# Patient Record
Sex: Male | Born: 1985 | Race: White | Hispanic: No | Marital: Single | Smoking: Current every day smoker
Health system: Southern US, Community
[De-identification: ages and names within clinical notes are randomized; demographics above are authoritative.]

## PROBLEM LIST (undated history)

## (undated) DIAGNOSIS — K219 Gastro-esophageal reflux disease without esophagitis: Secondary | ICD-10-CM

## (undated) DIAGNOSIS — F1910 Other psychoactive substance abuse, uncomplicated: Secondary | ICD-10-CM

## (undated) HISTORY — PX: WISDOM TOOTH EXTRACTION: SHX21

---
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
## 2012-07-18 ENCOUNTER — Encounter (HOSPITAL_COMMUNITY): Payer: Self-pay | Admitting: *Deleted

## 2012-07-18 DIAGNOSIS — Y929 Unspecified place or not applicable: Secondary | ICD-10-CM | POA: Insufficient documentation

## 2012-07-18 DIAGNOSIS — Y990 Civilian activity done for income or pay: Secondary | ICD-10-CM | POA: Insufficient documentation

## 2012-07-18 DIAGNOSIS — X503XXA Overexertion from repetitive movements, initial encounter: Secondary | ICD-10-CM | POA: Insufficient documentation

## 2012-07-18 DIAGNOSIS — F17200 Nicotine dependence, unspecified, uncomplicated: Secondary | ICD-10-CM | POA: Insufficient documentation

## 2012-07-18 DIAGNOSIS — Y9389 Activity, other specified: Secondary | ICD-10-CM | POA: Insufficient documentation

## 2012-07-18 DIAGNOSIS — IMO0002 Reserved for concepts with insufficient information to code with codable children: Secondary | ICD-10-CM | POA: Insufficient documentation

## 2012-07-18 NOTE — ED Notes (Signed)
The pt has had  Pain in his lower back or just above his waist since he lifted an object today at work.  No previous history

## 2012-07-19 ENCOUNTER — Emergency Department (HOSPITAL_COMMUNITY)
Admission: EM | Admit: 2012-07-19 | Discharge: 2012-07-19 | Disposition: A | Discharge status: Home / Self Care | Payer: Self-pay | Attending: Emergency Medicine | Admitting: Emergency Medicine

## 2012-07-19 DIAGNOSIS — S39012A Strain of muscle, fascia and tendon of lower back, initial encounter: Secondary | ICD-10-CM

## 2012-07-19 MED ORDER — METHOCARBAMOL 500 MG PO TABS: 500.0000 mg | ORAL_TABLET | Freq: Two times a day (BID) | ORAL | Status: DC

## 2012-07-19 MED ORDER — IBUPROFEN 800 MG PO TABS: 800.0000 mg | ORAL_TABLET | Freq: Three times a day (TID) | ORAL | Status: DC | PRN

## 2012-07-19 NOTE — ED Provider Notes (Signed)
Medical screening examination/treatment/procedure(s) were performed by non-physician practitioner and as supervising physician I was immediately available for consultation/collaboration.  Jasmine Awe, MD 07/19/12 [phone]

## 2012-07-19 NOTE — ED Provider Notes (Signed)
History     CSN: [id number]  Arrival date & time 07/18/12  2239   First MD Initiated Contact with Patient 07/19/12 0055      Chief Complaint  Patient presents with  . Back Pain   HPI  History provided by the patient. Patient is a 27 year old male with no significant PMH who presents with complaints of low back pain and injury at work. 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 Pain is greatest on the right side. Injury occurred early in his work shift and he continued to work with worsening pain. He reports that a fellow coworker gave him a Tylenol 3 which did not help significantly with pain. Pain does not radiate. Denies any weakness or numbness in the extremities. No urinary or fecal incontinence, urinary retention or perineal numbness. Denies any previous back injuries or pain. He has not used any other treatments for symptoms. Pain is worse with movement or position. No other aggravating or alleviating factors. No other associated symptoms.     History reviewed. No pertinent past medical history.  History reviewed. No pertinent past surgical history.  No family history on file.  History  Substance Use Topics  . Smoking status: Current Every Day Smoker  . Smokeless tobacco: Not on file  . Alcohol Use: Yes      Review of Systems  Respiratory: Negative for shortness of breath.   Cardiovascular: Negative for chest pain.  Genitourinary: Negative for dysuria, frequency, hematuria and flank pain.  Musculoskeletal: Positive for back pain.  Neurological: Negative for weakness and numbness.  All other systems reviewed and are negative.    Allergies  Review of patient's allergies indicates no known allergies.  Home Medications  No current outpatient prescriptions on file.  BP 128/90  Pulse 106  Temp(Src) 97.6 F (36.4 C) (Oral)  Resp 16  SpO2 100%  Physical Exam  Nursing  note and vitals reviewed. Constitutional: He is oriented to person, place, and time. He appears well-developed and well-nourished. No distress.  HENT:  Head: Normocephalic.  Cardiovascular: Normal rate and regular rhythm.   Pulmonary/Chest: Effort normal and breath sounds normal. No respiratory distress. He has no wheezes. He has no rales.  Abdominal: Soft.  Musculoskeletal: Normal range of motion. He exhibits no edema and no tenderness.       Cervical back: Normal.       Thoracic back: Normal.       Lumbar back: He exhibits tenderness. He exhibits normal range of motion and no bony tenderness.       Back:  Neurological: He is alert and oriented to person, place, and time. He has normal strength. No sensory deficit. Gait normal.  Skin: Skin is warm. No erythema.  Psychiatric: He has a normal mood and affect. His behavior is normal.    ED Course  Procedures     1. Strain of lumbar paraspinal muscle, initial encounter       MDM  1:00 AM patient seen and evaluated. Patient appears well. Normal gait. No spinous tenderness. At this time suspect muscle sprain and injury. Will instruct on conservative treatment.        Angus Seller, PA-C 07/19/12 0126

## 2012-07-19 NOTE — ED Notes (Signed)
Pt reports lifting boxes at work last evening and injuring his back. States that he has muscle soreness, pain in right side of back. Pt states injury occurred around 2030 last evening. States he took a tylenol #3 around 2230 with no relief.

## 2014-04-09 ENCOUNTER — Encounter (HOSPITAL_COMMUNITY): Payer: Self-pay | Admitting: Emergency Medicine

## 2014-04-09 ENCOUNTER — Emergency Department (HOSPITAL_COMMUNITY)
Admission: EM | Admit: 2014-04-09 | Discharge: 2014-04-09 | Disposition: A | Discharge status: Home / Self Care | Payer: BLUE CROSS/BLUE SHIELD | Attending: Emergency Medicine | Admitting: Emergency Medicine

## 2014-04-09 DIAGNOSIS — Y998 Other external cause status: Secondary | ICD-10-CM | POA: Diagnosis not present

## 2014-04-09 DIAGNOSIS — S29092A Other injury of muscle and tendon of back wall of thorax, initial encounter: Secondary | ICD-10-CM | POA: Diagnosis not present

## 2014-04-09 DIAGNOSIS — Y92410 Unspecified street and highway as the place of occurrence of the external cause: Secondary | ICD-10-CM | POA: Diagnosis not present

## 2014-04-09 DIAGNOSIS — Z720 Tobacco use: Secondary | ICD-10-CM | POA: Diagnosis not present

## 2014-04-09 DIAGNOSIS — M546 Pain in thoracic spine: Secondary | ICD-10-CM

## 2014-04-09 DIAGNOSIS — Y9389 Activity, other specified: Secondary | ICD-10-CM | POA: Insufficient documentation

## 2014-04-09 MED ORDER — NAPROXEN 500 MG PO TABS: 500.0000 mg | ORAL_TABLET | Freq: Two times a day (BID) | ORAL | Status: DC

## 2014-04-09 MED ORDER — KETOROLAC TROMETHAMINE 60 MG/2ML IM SOLN
60.0000 mg | Freq: Once | INTRAMUSCULAR | Status: AC
  Administered 2014-04-09: 60 mg via INTRAMUSCULAR
  Filled 2014-04-09: qty 2

## 2014-04-09 MED ORDER — LORAZEPAM 1 MG PO TABS
1.0000 mg | ORAL_TABLET | Freq: Once | ORAL | Status: AC
  Administered 2014-04-09: 1 mg via ORAL
  Filled 2014-04-09: qty 1

## 2014-04-09 MED ORDER — METHOCARBAMOL 500 MG PO TABS: 500.0000 mg | ORAL_TABLET | Freq: Two times a day (BID) | ORAL | Status: DC
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
## 2014-04-09 NOTE — ED Notes (Signed)
Patient instructed to wait until 1215 to leave department due to administration of IM injection.

## 2014-04-09 NOTE — Discharge Instructions (Signed)
Arthralgia Your caregiver has diagnosed you as suffering from an arthralgia. Arthralgia means there is pain in a joint. This can come from many reasons including:  Bruising the joint which causes soreness (inflammation) in the joint.  Wear and tear on the joints which occur as we grow older (osteoarthritis).  Overusing the joint.  Various forms of arthritis.  Infections of the joint. Regardless of the cause of pain in your joint, most of these different pains respond to anti-inflammatory drugs and rest. The exception to this is when a joint is infected, and these cases are treated with antibiotics, if it is a bacterial infection. HOME CARE INSTRUCTIONS   Rest the injured area for as long as directed by your caregiver. Then slowly start using the joint as directed by your caregiver and as the pain allows. Crutches as directed may be useful if the ankles, knees or hips are involved. If the knee was splinted or casted, continue use and care as directed. If an stretchy or elastic wrapping bandage has been applied today, it should be removed and re-applied every 3 to 4 hours. It should not be applied tightly, but firmly enough to keep swelling down. Watch toes and feet for swelling, bluish discoloration, coldness, numbness or excessive pain. If any of these problems (symptoms) occur, remove the ace bandage and re-apply more loosely. If these symptoms persist, contact your caregiver or return to this location.  For the first 24 hours, keep the injured extremity elevated on pillows while lying down.  Apply ice for 15-20 minutes to the sore joint every couple hours while awake for the first half day. Then 03-04 times per day for the first 48 hours. Put the ice in a plastic bag and place a towel between the bag of ice and your skin.  Wear any splinting, casting, elastic bandage applications, or slings as instructed.  Only take over-the-counter or prescription medicines for pain, discomfort, or fever as  directed by your caregiver. Do not use aspirin immediately after the injury unless instructed by your physician. Aspirin can cause increased bleeding and bruising of the tissues.  If you were given crutches, continue to use them as instructed and do not resume weight bearing on the sore joint until instructed. Persistent pain and inability to use the sore joint as directed for more than 2 to 3 days are warning signs indicating that you should see a caregiver for a follow-up visit as soon as possible. Initially, a hairline fracture (break in bone) may not be evident on X-rays. Persistent pain and swelling indicate that further evaluation, non-weight bearing or use of the joint (use of crutches or slings as instructed), or further X-rays are indicated. X-rays may sometimes not show a small fracture until a week or 10 days later. Make a follow-up appointment with your own caregiver or one to whom we have referred you. A radiologist (specialist in reading X-rays) may read your X-rays. Make sure you know how you are to obtain your X-ray results. Do not assume everything is normal if you do not hear from [place]. Back Pain, Adult Low back pain is very common. About 1 in 5 people have back pain.The cause of low back pain is rarely dangerous. The pain often gets better over time.About half of people with a sudden onset of back pain feel better in just 2 weeks. About 8 in 10 people feel better by 6 weeks.  CAUSES Some common causes of back pain include:  Strain of the muscles or  ligaments supporting the spine.  Wear and tear (degeneration) of the spinal discs.  Arthritis.  Direct injury to the back. DIAGNOSIS Most of the time, the direct cause of low back pain is not known.However, back pain can be treated effectively even when the exact cause of the pain is unknown.Answering your caregiver's questions about your overall health and symptoms is one of the most accurate ways to make sure the cause of your pain  is not dangerous. If your caregiver needs more information, he or she may order lab work or imaging tests (X-rays or MRIs).However, even if imaging tests show changes in your back, this usually does not require surgery. HOME CARE INSTRUCTIONS For many people, back pain returns.Since low back pain is rarely dangerous, it is often a condition that people can learn to [facility] their own.   Remain active. It is stressful on the back to sit or stand in one place. Do not sit, drive, or stand in one place for more than 30 minutes at a time. Take short walks on level surfaces as soon as pain allows.Try to increase the length of time you walk each day.  Do not stay in bed.Resting more than 1 or 2 days can delay your recovery.  Do not avoid exercise or work.Your body is made to move.It is not dangerous to be active, even though your back may hurt.Your back will likely heal faster if you return to being active before your pain is gone.  Pay attention to your body when you bend and lift. Many people have less discomfortwhen lifting if they bend their knees, keep the load close to their bodies,and avoid twisting. Often, the most comfortable positions are those that put less stress on your recovering back.  Find a comfortable position to sleep. Use a firm mattress and lie on your side with your knees slightly bent. If you lie on your back, put a pillow under your knees.  Only take over-the-counter or prescription medicines as directed by your caregiver. Over-the-counter medicines to reduce pain and inflammation are often the most helpful.Your caregiver may prescribe muscle relaxant drugs.These medicines help dull your pain so you can more quickly return to your normal activities and healthy exercise.  Put ice on the injured area.  Put ice in a plastic bag.  Place a towel between your skin and the bag.  Leave the ice on for 15-20 minutes, 03-04 times a day for the first 2 to 3 days. After that,  ice and heat may be alternated to reduce pain and spasms.  Ask your caregiver about trying back exercises and gentle massage. This may be of some benefit.  Avoid feeling anxious or stressed.Stress increases muscle tension and can worsen back pain.It is important to recognize when you are anxious or stressed and learn ways to manage it.Exercise is a great option. SEEK MEDICAL CARE IF:  You have pain that is not relieved with rest or medicine.  You have pain that does not improve in 1 week.  You have new symptoms.  You are generally not feeling well. SEEK IMMEDIATE MEDICAL CARE IF:   You have pain that radiates from your back into your legs.  You develop new bowel or bladder control problems.  You have unusual weakness or numbness in your arms or legs.  You develop nausea or vomiting.  You develop abdominal pain.  You feel faint. Document Released: 02/01/2005 Document Revised: 08/03/2011 Document Reviewed: 06/05/2013 [facility] Patient Information 2015 [place], [place]. This information is not  intended to replace advice given to you by your health care provider. Make sure you discuss any questions you have with your health care provider.  SEEK MEDICAL CARE IF: Bruising, swelling, or pain increases. SEEK IMMEDIATE MEDICAL CARE IF:   Your fingers or toes are numb or blue.  The pain is not responding to medications and continues to stay the same or get worse.  The pain in your joint becomes severe.  You develop a fever over 102 F (38.9 C).  It becomes impossible to move or use the joint. MAKE SURE YOU:   Understand these instructions.  Will watch your condition.  Will get help right away if you are not doing well or get worse. Document Released: 02/01/2005 Document Revised: 04/26/2011 Document Reviewed: 09/20/2007 [facility] Patient Information 2015 [place], [place]. This information is not intended to replace advice given to you by your health care provider. Make sure  you discuss any questions you have with your health care provider.  It is important to follow up with your primary care, [place] and wellness for further evaluation and management of your symptoms. Return to ED for new or worsening symptoms. Please take your pain medications as directed. You may not drive, operate heavy machinery while using your Robaxin.

## 2017-11-03 ENCOUNTER — Emergency Department (HOSPITAL_COMMUNITY): Payer: BLUE CROSS/BLUE SHIELD

## 2017-11-03 ENCOUNTER — Emergency Department (HOSPITAL_COMMUNITY)
Admission: EM | Admit: 2017-11-03 | Discharge: 2017-11-03 | Disposition: A | Discharge status: Home / Self Care | Payer: BLUE CROSS/BLUE SHIELD | Attending: Emergency Medicine | Admitting: Emergency Medicine

## 2017-11-03 ENCOUNTER — Encounter (HOSPITAL_COMMUNITY): Payer: Self-pay | Admitting: Emergency Medicine

## 2017-11-03 DIAGNOSIS — Y999 Unspecified external cause status: Secondary | ICD-10-CM | POA: Diagnosis not present

## 2017-11-03 DIAGNOSIS — F17210 Nicotine dependence, cigarettes, uncomplicated: Secondary | ICD-10-CM | POA: Insufficient documentation

## 2017-11-03 DIAGNOSIS — Y939 Activity, unspecified: Secondary | ICD-10-CM | POA: Diagnosis not present

## 2017-11-03 DIAGNOSIS — M25532 Pain in left wrist: Secondary | ICD-10-CM | POA: Diagnosis present

## 2017-11-03 DIAGNOSIS — W010XXA Fall on same level from slipping, tripping and stumbling without subsequent striking against object, initial encounter: Secondary | ICD-10-CM | POA: Diagnosis not present

## 2017-11-03 NOTE — ED Triage Notes (Signed)
Pt c/o left wrist injury playing basketball 2 days ago. Reports hurts with movement. Doesn't use pain meds

## 2017-11-03 NOTE — Discharge Instructions (Addendum)
As discussed, today's evaluation has been generally reassuring peer There is no evidence for fracture of your wrist. You likely sprained the ligaments of your wrist. Please use the provided wrist splint for the next 10 days.  Please use ibuprofen, 400 mg, 3 times daily for pain control, as well as ice packs.

## 2017-11-03 NOTE — ED Provider Notes (Signed)
[facility]-EMERGENCY DEPT Provider Note   CSN: [id number] Arrival date & time: 11/03/17  0849     History   Chief Complaint Chief Complaint  Patient presents with  . Wrist Pain    HPI Ian Walker is a 32 y.o. male.  HPI Patient presents 2 days after sustaining an injury to his left wrist, with concern of ongoing pain. Patient fell, onto his wrist which was in extension. Since that time he has had soreness, which is actually improved, but is persistent. I will relief with cryotherapy, no medication taken. No elbow, shoulder or other pain. He states that he is generally well, but has been unable to work secondary to the pain.  History reviewed. No pertinent past medical history.  There are no active problems to display for this patient.   Past Surgical History:  Procedure Laterality Date  . WISDOM TOOTH EXTRACTION          Home Medications    Prior to Admission medications   Not on File    Family History No family history on file.  Social History Social History   Tobacco Use  . Smoking status: Current Every Day Smoker    Packs/day: 0.75    Types: Cigarettes  . Smokeless tobacco: Never Used  Substance Use Topics  . Alcohol use: Yes    Comment: occasionally  . Drug use: No     Allergies   Patient has no known allergies.   Review of Systems Review of Systems  Constitutional: Negative for fever.  Respiratory: Negative for shortness of breath.   Cardiovascular: Negative for chest pain.  Musculoskeletal:       Negative aside from HPI  Skin:       Negative aside from HPI  Allergic/Immunologic: Negative for immunocompromised state.  Neurological: Negative for weakness.     Physical Exam Updated Vital Signs BP (!) 148/82 (BP Location: Left Arm)   Pulse 88   Temp 98.6 F (37 C) (Oral)   Resp 16   Ht 6' 1.5" (1.867 m)   Wt 77.1 kg   SpO2 99%   BMI 22.12 kg/m   Physical Exam  Constitutional: He is oriented to  person, place, and time. He appears well-developed. No distress.  HENT:  Head: Normocephalic and atraumatic.  Eyes: Conjunctivae and EOM are normal.  Cardiovascular: Normal rate, regular rhythm and intact distal pulses.  Pulmonary/Chest: Effort normal. No stridor. No respiratory distress.  Abdominal: He exhibits no distension.  Musculoskeletal: He exhibits no edema.       Left elbow: Normal.       Left wrist: He exhibits tenderness, bony tenderness and swelling. He exhibits normal range of motion, no effusion, no crepitus, no deformity and no laceration.  Neurological: He is alert and oriented to person, place, and time.  Skin: Skin is warm and dry.  Psychiatric: He has a normal mood and affect.  Nursing note and vitals reviewed.    ED Treatments / Results   Radiology Dg Wrist Complete Left  Result Date: 11/03/2017 CLINICAL DATA:  Basketball injury EXAM: LEFT WRIST - COMPLETE 3+ VIEW COMPARISON:  None. FINDINGS: Mild posterior soft tissue swelling within the left wrist. No acute bony abnormality. Specifically, no fracture, subluxation, or dislocation. Joint spaces maintained. IMPRESSION: No acute bony abnormality. Electronically Signed   By: Charlett Nose M.D.   On: 11/03/2017 09:32    Procedures Procedures (including critical care time)  Medications Ordered in ED Medications - No data to display  Initial Impression / Assessment and Plan / ED Course  I have reviewed the triage vital signs and the nursing notes.  Pertinent labs & imaging results that were available during my care of the patient were reviewed by me and considered in my medical decision making (see chart for details).  After the initial evaluation patient had placement in wrist immobilization splint by orthopedic technician.  Young male presents with isolated wrist pain. Patient is otherwise well-appearing, in no distress, has no distal neurovascular compromise X-ray reassuring, and I reviewed the images myself,  demonstrated them to the patient as well. Immobilization, NSAID, ice pack for likely wrist sprain.  Final Clinical Impressions(s) / ED Diagnoses   Final diagnoses:  Left wrist pain     Gerhard MunchLockwood, Keondra Haydu, MD 11/03/17 [phone]

## 2017-11-03 NOTE — ED Notes (Signed)
Patient unable to sign. Patient verbalizes consent and understanding of discharge paperwork.

## 2017-11-03 NOTE — ED Notes (Signed)
Ortho tech called 

## 2020-08-29 ENCOUNTER — Other Ambulatory Visit: Payer: Self-pay

## 2020-08-29 ENCOUNTER — Emergency Department (HOSPITAL_BASED_OUTPATIENT_CLINIC_OR_DEPARTMENT_OTHER): Payer: BC Managed Care – PPO

## 2020-08-29 ENCOUNTER — Other Ambulatory Visit (HOSPITAL_BASED_OUTPATIENT_CLINIC_OR_DEPARTMENT_OTHER): Payer: Self-pay

## 2020-08-29 ENCOUNTER — Encounter (HOSPITAL_BASED_OUTPATIENT_CLINIC_OR_DEPARTMENT_OTHER): Payer: Self-pay

## 2020-08-29 ENCOUNTER — Emergency Department (HOSPITAL_BASED_OUTPATIENT_CLINIC_OR_DEPARTMENT_OTHER)
Admission: EM | Admit: 2020-08-29 | Discharge: 2020-08-29 | Disposition: A | Discharge status: Home / Self Care | Payer: BC Managed Care – PPO | Attending: Emergency Medicine | Admitting: Emergency Medicine

## 2020-08-29 DIAGNOSIS — Z20822 Contact with and (suspected) exposure to covid-19: Secondary | ICD-10-CM | POA: Insufficient documentation

## 2020-08-29 DIAGNOSIS — J181 Lobar pneumonia, unspecified organism: Secondary | ICD-10-CM | POA: Insufficient documentation

## 2020-08-29 DIAGNOSIS — R509 Fever, unspecified: Secondary | ICD-10-CM | POA: Diagnosis present

## 2020-08-29 DIAGNOSIS — J189 Pneumonia, unspecified organism: Secondary | ICD-10-CM

## 2020-08-29 DIAGNOSIS — F17210 Nicotine dependence, cigarettes, uncomplicated: Secondary | ICD-10-CM | POA: Diagnosis not present

## 2020-08-29 HISTORY — DX: Gastro-esophageal reflux disease without esophagitis: K21.9

## 2020-08-29 LAB — RESP PANEL BY RT-PCR (FLU A&B, COVID) ARPGX2
Influenza A by PCR: NEGATIVE
Influenza B by PCR: NEGATIVE
SARS Coronavirus 2 by RT PCR: NEGATIVE

## 2020-08-29 MED ORDER — AZITHROMYCIN 250 MG PO TABS
250.0000 mg | ORAL_TABLET | Freq: Every day | ORAL | 0 refills | Status: DC
  Filled 2020-08-29: qty 6, 5d supply, fill #0

## 2020-08-29 MED ORDER — ONDANSETRON 4 MG PO TBDP
4.0000 mg | ORAL_TABLET | Freq: Three times a day (TID) | ORAL | 0 refills | Status: DC | PRN
  Filled 2020-08-29: qty 20, 7d supply, fill #0

## 2020-08-29 MED ORDER — KETOROLAC TROMETHAMINE 30 MG/ML IJ SOLN
15.0000 mg | Freq: Once | INTRAMUSCULAR | Status: AC
  Administered 2020-08-29: 15 mg via INTRAMUSCULAR
  Filled 2020-08-29: qty 1

## 2020-08-29 MED ORDER — AMOXICILLIN-POT CLAVULANATE 875-125 MG PO TABS
1.0000 | ORAL_TABLET | Freq: Two times a day (BID) | ORAL | 0 refills | Status: AC
  Filled 2020-08-29: qty 10, 5d supply, fill #0

## 2020-08-29 NOTE — Discharge Instructions (Addendum)
There is evidence of pneumonia on the chest x-ray.  Antiinflammatory medications: Take 600 mg of ibuprofen every 6 hours or 440 mg (over the counter dose) to 500 mg (prescription dose) of naproxen every 12 hours for the next 3 days. After this time, these medications may be used as needed for pain. Take these medications with food to avoid upset stomach. Choose only one of these medications, do not take them together. Acetaminophen (generic for Tylenol): Should you continue to have additional pain while taking the ibuprofen or naproxen, you may add in acetaminophen as needed. Your daily total maximum amount of acetaminophen from all sources should be limited to 4000mg /day for persons without liver problems, or 2000mg /day for those with liver problems.  Please take all of your antibiotics until finished!   You may develop abdominal discomfort or diarrhea from the antibiotic.  You may help offset this with probiotics which you can buy or get in yogurt. Do not eat or take the probiotics until 2 hours after your antibiotic.   Nausea/vomiting: Use the ondansetron (generic for Zofran) for nausea or vomiting.  This medication may not prevent all vomiting or nausea, but can help facilitate better hydration. Things that can help with nausea/vomiting also include peppermint/menthol candies, vitamin B12, and ginger.  Follow-up: Follow-up with a primary care provider for any further management of this issue.  Return: Return to emergency department for shortness of breath, worsening pain, persistent vomiting, or any other major concerns.

## 2020-08-29 NOTE — ED Provider Notes (Signed)
[facility] EMERGENCY DEPARTMENT Provider Note   CSN: [id number] Arrival date & time: 08/29/20  1129     History Chief Complaint  Patient presents with   Fever    Ian Walker is a 35 y.o. male.   Fever Associated symptoms: chest pain, cough, myalgias and nausea   Associated symptoms: no diarrhea and no vomiting       Ian Walker is a 35 y.o. male, with a history of GERD, presenting to the ED with fever, chills, sore throat, cough, nausea beginning 3 days ago. Also endorses soreness in the right upper chest and to the right of the sternum, worse with movement or deep breathing, mild to moderate, nonradiating from these locations. Fever with T-max 101 F. 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 Denies shortness of breath, abdominal pain, difficulty swallowing, diarrhea, or any other complaints.    Past Medical History:  Diagnosis Date   GERD (gastroesophageal reflux disease)     There are no problems to display for this patient.   Past Surgical History:  Procedure Laterality Date   WISDOM TOOTH EXTRACTION         No family history on file.  Social History   Tobacco Use   Smoking status: Every Day    Packs/day: 0.75    Types: Cigarettes   Smokeless tobacco: Never  Vaping Use   Vaping Use: Never used  Substance Use Topics   Alcohol use: Yes    Comment: weekly   Drug use: Yes    Types: Marijuana    Home Medications Prior to Admission medications   Medication Sig Start Date End Date Taking? Authorizing Provider  amoxicillin-clavulanate (AUGMENTIN) 875-125 MG tablet Take 1 tablet by mouth every 12 (twelve) hours for 5 days. 08/29/20 09/03/20 Yes Tatiyana Foucher C, PA-C  azithromycin (ZITHROMAX) 250 MG tablet Take first 2 tablets by mouth together, then 1 tablet every day until finished. 08/29/20  Yes Danea Manter C, PA-C  ondansetron (ZOFRAN ODT)  4 MG disintegrating tablet Take 1 tablet (4 mg total) by mouth every 8 (eight) hours as needed for nausea or vomiting. 08/29/20  Yes Herrick Hartog C, PA-C    Allergies    Patient has no known allergies.  Review of Systems   Review of Systems  Constitutional:  Positive for fever.  Respiratory:  Positive for cough. Negative for shortness of breath.   Cardiovascular:  Positive for chest pain. Negative for leg swelling.  Gastrointestinal:  Positive for nausea. Negative for abdominal pain, diarrhea and vomiting.  Musculoskeletal:  Positive for myalgias.  Neurological:  Negative for syncope and weakness.   Physical Exam Updated Vital Signs BP (!) 147/107 (BP Location: Left Arm)   Pulse (!) 117   Temp 98.2 F (36.8 C) (Oral)   Resp 18   Ht 6\' 1"  (1.854 m)   Wt 74.8 kg   SpO2 98%   BMI 21.77 kg/m   Physical Exam Vitals and nursing note reviewed.  Constitutional:      General: He is not in acute distress.    Appearance: He is well-developed. He is not diaphoretic.  HENT:     Head: Normocephalic and atraumatic.     Mouth/Throat:     Mouth: Mucous membranes are moist.     Pharynx: Oropharynx is clear.  Eyes:     Conjunctiva/sclera: Conjunctivae normal.  Cardiovascular:  Rate and Rhythm: Normal rate and regular rhythm.     Pulses: Normal pulses.          Radial pulses are 2+ on the right side and 2+ on the left side.       Posterior tibial pulses are 2+ on the right side and 2+ on the left side.     Heart sounds: Normal heart sounds. No murmur heard. Pulmonary:     Effort: Pulmonary effort is normal. No respiratory distress.     Breath sounds: Normal breath sounds.     Comments: No increased work of breathing.  Speaks in full sentences without difficulty. Abdominal:     Tenderness: There is no guarding.  Musculoskeletal:     Cervical back: Neck supple.     Right lower leg: No edema.     Left lower leg: No edema.  Lymphadenopathy:     Cervical: No cervical adenopathy.   Skin:    General: Skin is warm and dry.  Neurological:     Mental Status: He is alert.  Psychiatric:        Mood and Affect: Mood and affect normal.        Speech: Speech normal.        Behavior: Behavior normal.    ED Results / Procedures / Treatments   Labs (all labs ordered are listed, but only abnormal results are displayed) Labs Reviewed  RESP PANEL BY RT-PCR (FLU A&B, COVID) ARPGX2    EKG None  Radiology DG Chest Portable 1 View  Result Date: 08/29/2020 CLINICAL DATA:  Fever, chills. EXAM: PORTABLE CHEST 1 VIEW COMPARISON:  None. FINDINGS: Normal cardiac silhouette. There is segmental airspace disease in the lateral aspect of the RIGHT upper lobe. LEFT lung clear. No effusion. IMPRESSION: RIGHT upper lobe pneumonia. Electronically Signed   By: Genevive Bi M.D.   On: 08/29/2020 14:39    Procedures Procedures   Medications Ordered in ED Medications  ketorolac (TORADOL) 30 MG/ML injection 15 mg (15 mg Intramuscular Given 08/29/20 1405)    ED Course  I have reviewed the triage vital signs and the nursing notes.  Pertinent labs & imaging results that were available during my care of the patient were reviewed by me and considered in my medical decision making (see chart for details).    MDM Rules/Calculators/A&P                          Patient presents with cough, fever, chest discomfort. Patient is nontoxic appearing, afebrile, not tachycardic at the time of my exam, not tachypneic, not hypotensive, maintains excellent SPO2 on room air, and is in no apparent distress.   I reviewed and interpreted the patient's labs and radiological studies. Influenza and COVID-negative. Focal area of pneumonia in the right upper lung, consistent with the location of patient's discomfort. Low suspicion for sepsis or need for admission. The patient was given instructions for home care as well as return precautions. Patient voices understanding of these instructions, accepts the  plan, and is comfortable with discharge.    Final Clinical Impression(s) / ED Diagnoses Final diagnoses:  Community acquired pneumonia of right upper lobe of lung    Rx / DC Orders ED Discharge Orders          Ordered    azithromycin (ZITHROMAX) 250 MG tablet  Daily        08/29/20 1542    amoxicillin-clavulanate (AUGMENTIN) 875-125 MG tablet  Every 12 hours  08/29/20 1542    ondansetron (ZOFRAN ODT) 4 MG disintegrating tablet  Every 8 hours PRN        08/29/20 1542             Anselm Pancoast, PA-C 08/29/20 1629    Gwyneth Sprout, MD 08/31/20 2207

## 2020-08-29 NOTE — ED Triage Notes (Addendum)
pt c/o fever, chills, n/v started 4/12-denies resp sx-states sx have resolved except for chest pain when he takes a deep breath and when he reaches with his right arm-NAD-steady gait

## 2020-09-18 ENCOUNTER — Other Ambulatory Visit: Payer: Self-pay

## 2020-09-18 ENCOUNTER — Encounter (HOSPITAL_BASED_OUTPATIENT_CLINIC_OR_DEPARTMENT_OTHER): Payer: Self-pay | Admitting: Emergency Medicine

## 2020-09-18 ENCOUNTER — Emergency Department (HOSPITAL_BASED_OUTPATIENT_CLINIC_OR_DEPARTMENT_OTHER): Payer: BC Managed Care – PPO

## 2020-09-18 ENCOUNTER — Ambulatory Visit: Payer: Self-pay | Admitting: *Deleted

## 2020-09-18 ENCOUNTER — Emergency Department (HOSPITAL_BASED_OUTPATIENT_CLINIC_OR_DEPARTMENT_OTHER)
Admission: EM | Admit: 2020-09-18 | Discharge: 2020-09-18 | Disposition: A | Discharge status: Home / Self Care | Payer: BC Managed Care – PPO | Attending: Emergency Medicine | Admitting: Emergency Medicine

## 2020-09-18 DIAGNOSIS — F17210 Nicotine dependence, cigarettes, uncomplicated: Secondary | ICD-10-CM | POA: Insufficient documentation

## 2020-09-18 DIAGNOSIS — R590 Localized enlarged lymph nodes: Secondary | ICD-10-CM | POA: Insufficient documentation

## 2020-09-18 DIAGNOSIS — Z20822 Contact with and (suspected) exposure to covid-19: Secondary | ICD-10-CM | POA: Insufficient documentation

## 2020-09-18 DIAGNOSIS — R0602 Shortness of breath: Secondary | ICD-10-CM | POA: Insufficient documentation

## 2020-09-18 DIAGNOSIS — J189 Pneumonia, unspecified organism: Secondary | ICD-10-CM | POA: Insufficient documentation

## 2020-09-18 DIAGNOSIS — R079 Chest pain, unspecified: Secondary | ICD-10-CM | POA: Diagnosis present

## 2020-09-18 LAB — CBC WITH DIFFERENTIAL/PLATELET
Abs Immature Granulocytes: 0.04 10*3/uL (ref 0.00–0.07)
Basophils Absolute: 0 10*3/uL (ref 0.0–0.1)
Basophils Relative: 0 %
Eosinophils Absolute: 0.1 10*3/uL (ref 0.0–0.5)
Eosinophils Relative: 1 %
HCT: 38.9 % — ABNORMAL LOW (ref 39.0–52.0)
Hemoglobin: 13 g/dL (ref 13.0–17.0)
Immature Granulocytes: 0 %
Lymphocytes Relative: 7 %
Lymphs Abs: 0.8 10*3/uL (ref 0.7–4.0)
MCH: 29.9 pg (ref 26.0–34.0)
MCHC: 33.4 g/dL (ref 30.0–36.0)
MCV: 89.4 fL (ref 80.0–100.0)
Monocytes Absolute: 0.7 10*3/uL (ref 0.1–1.0)
Monocytes Relative: 6 %
Neutro Abs: 9.7 10*3/uL — ABNORMAL HIGH (ref 1.7–7.7)
Neutrophils Relative %: 86 %
Platelets: 256 10*3/uL (ref 150–400)
RBC: 4.35 MIL/uL (ref 4.22–5.81)
RDW: 12.4 % (ref 11.5–15.5)
WBC: 11.4 10*3/uL — ABNORMAL HIGH (ref 4.0–10.5)
nRBC: 0 % (ref 0.0–0.2)

## 2020-09-18 LAB — URINALYSIS, ROUTINE W REFLEX MICROSCOPIC
Bilirubin Urine: NEGATIVE
Glucose, UA: NEGATIVE mg/dL
Hgb urine dipstick: NEGATIVE
Ketones, ur: NEGATIVE mg/dL
Leukocytes,Ua: NEGATIVE
Nitrite: NEGATIVE
Protein, ur: NEGATIVE mg/dL
Specific Gravity, Urine: <1.005 — ABNORMAL LOW (ref 1.005–1.030)
pH: 7 (ref 5.0–8.0)

## 2020-09-18 LAB — COMPREHENSIVE METABOLIC PANEL
ALT: 12 U/L (ref 0–44)
AST: 21 U/L (ref 15–41)
Albumin: 3.7 g/dL (ref 3.5–5.0)
Alkaline Phosphatase: 62 U/L (ref 38–126)
Anion gap: 9 (ref 5–15)
BUN: 14 mg/dL (ref 6–20)
CO2: 31 mmol/L (ref 22–32)
Calcium: 8.8 mg/dL — ABNORMAL LOW (ref 8.9–10.3)
Chloride: 98 mmol/L (ref 98–111)
Creatinine, Ser: 0.9 mg/dL (ref 0.61–1.24)
GFR, Estimated: >60 mL/min (ref >60)
Glucose, Bld: 83 mg/dL (ref 70–99)
Potassium: 3.8 mmol/L (ref 3.5–5.1)
Sodium: 138 mmol/L (ref 135–145)
Total Bilirubin: 0.2 mg/dL — ABNORMAL LOW (ref 0.3–1.2)
Total Protein: 6.3 g/dL — ABNORMAL LOW (ref 6.5–8.1)

## 2020-09-18 LAB — RAPID URINE DRUG SCREEN, HOSP PERFORMED
Amphetamines: NOT DETECTED
Barbiturates: NOT DETECTED
Benzodiazepines: NOT DETECTED
Cocaine: NOT DETECTED
Opiates: NOT DETECTED
Tetrahydrocannabinol: POSITIVE — AB

## 2020-09-18 LAB — RESP PANEL BY RT-PCR (FLU A&B, COVID) ARPGX2
Influenza A by PCR: NEGATIVE
Influenza B by PCR: NEGATIVE
SARS Coronavirus 2 by RT PCR: NEGATIVE

## 2020-09-18 LAB — LIPASE, BLOOD: Lipase: 31 U/L (ref 11–51)

## 2020-09-18 LAB — TROPONIN I (HIGH SENSITIVITY): Troponin I (High Sensitivity): <2 ng/L (ref <18)

## 2020-09-18 LAB — LACTIC ACID, PLASMA: Lactic Acid, Venous: 0.9 mmol/L (ref 0.5–1.9)

## 2020-09-18 MED ORDER — SODIUM CHLORIDE 0.9 % IV SOLN: Freq: Once | INTRAVENOUS | Status: AC

## 2020-09-18 MED ORDER — DOXYCYCLINE HYCLATE 100 MG PO CAPS: 100.0000 mg | ORAL_CAPSULE | Freq: Two times a day (BID) | ORAL | 0 refills | Status: DC

## 2020-09-18 MED ORDER — IOHEXOL 350 MG/ML SOLN
100.0000 mL | Freq: Once | INTRAVENOUS | Status: AC | PRN
  Administered 2020-09-18: 100 mL via INTRAVENOUS

## 2020-09-18 NOTE — Telephone Encounter (Signed)
Message from Leafy Ro sent at 09/18/2020  8:16 AM EDT  Pt called and went to er on 08/29/2020 was dx with PNA. Pt is calling and having issues breathing and he disconnected the call while I was transferring to triage nurse. I called pt back and vm came on no dpr on file that I could leave message      Attempted to call patient but no answer. Unable to leave message due to full mailbox. Noted in chart that patient is arrived at [facility] Emergency Department at [phone].

## 2020-09-18 NOTE — Progress Notes (Signed)
Case reviewed, will get in to see one of [place] in office within next week or two for potential EBUS.  Myrla Halsted MD PCCM

## 2020-09-18 NOTE — ED Provider Notes (Addendum)
[facility] EMERGENCY DEPARTMENT Provider Note   CSN: [id number] Arrival date & time: 09/18/20  [id number]     History Chief Complaint  Patient presents with   Chest Pain    Ian Walker is a 35 y.o. male.  HPI Patient was diagnosed with pneumonia on 7\15\2022 after visit to the emergency department.  At that time he had had fever and cough and chest pain.  Patient reports he took his antibiotics as prescribed.  He did start to feel better.  He reports however he is still having chest pain and the chest pain seem to have changed in quality and gotten worse.  He reports the pain is in his central chest and radiating around to the right.  He does report some exertional shortness of breath.  He denies any recurrence of fever.  Denies productive cough.  Denies lower extremity pain or swelling.  He reports he stands on his feet a lot and walks at work and has not noticed any kind of pain or swelling in the legs.  No abdominal pain vomiting or diarrhea.    Past Medical History:  Diagnosis Date   GERD (gastroesophageal reflux disease)     There are no problems to display for this patient.   Past Surgical History:  Procedure Laterality Date   WISDOM TOOTH EXTRACTION         History reviewed. No pertinent family history.  Social History   Tobacco Use   Smoking status: Every Day    Packs/day: 0.75    Types: Cigarettes   Smokeless tobacco: Never  Vaping Use   Vaping Use: Never used  Substance Use Topics   Alcohol use: Yes    Comment: weekly   Drug use: Yes    Types: Marijuana    Home Medications Prior to Admission medications   Medication Sig Start Date End Date Taking? Authorizing Provider  doxycycline (VIBRAMYCIN) 100 MG capsule Take 1 capsule (100 mg total) by mouth 2 (two) times daily. One po bid x 7 days 09/18/20  Yes Gabryela Kimbrell, Lebron ConnersMarcy, MD  azithromycin (ZITHROMAX) 250 MG tablet Take first 2 tablets by mouth together, then 1 tablet every day until finished.  08/29/20   Joy, Shawn C, PA-C  ondansetron (ZOFRAN ODT) 4 MG disintegrating tablet Take 1 tablet (4 mg total) by mouth every 8 (eight) hours as needed for nausea or vomiting. 08/29/20   Joy, Shawn C, PA-C    Allergies    Patient has no known allergies.  Review of Systems   Review of Systems 10 systems reviewed and negative except as per HPI Physical Exam Updated Vital Signs BP 126/78   Pulse 89   Temp 98.5 F (36.9 C) (Oral)   Resp (!) 22   Ht 6\' 2"  (1.88 m)   Wt 74.8 kg   SpO2 100%   BMI 21.17 kg/m   Physical Exam Constitutional:      Appearance: Normal appearance.  HENT:     Nose: Nose normal.     Mouth/Throat:     Mouth: Mucous membranes are moist.     Pharynx: Oropharynx is clear.     Comments: Patient has a couple of decayed teeth with dentition in generally good condition.  Posterior oropharynx widely patent without lesions or exudate. Eyes:     Extraocular Movements: Extraocular movements intact.     Conjunctiva/sclera: Conjunctivae normal.  Cardiovascular:     Rate and Rhythm: Normal rate and regular rhythm.     Pulses: Normal pulses.  Heart sounds: Normal heart sounds.  Pulmonary:     Effort: Pulmonary effort is normal.     Breath sounds: Normal breath sounds.  Abdominal:     General: There is no distension.     Palpations: Abdomen is soft.     Tenderness: There is no abdominal tenderness. There is no guarding.  Musculoskeletal:        General: No swelling or tenderness. Normal range of motion.     Right lower leg: No edema.     Left lower leg: No edema.  Skin:    General: Skin is warm and dry.  Neurological:     General: No focal deficit present.     Mental Status: He is alert and oriented to person, place, and time.     Coordination: Coordination normal.  Psychiatric:        Mood and Affect: Mood normal.    ED Results / Procedures / Treatments   Labs (all labs ordered are listed, but only abnormal results are displayed) Labs Reviewed   COMPREHENSIVE METABOLIC PANEL - Abnormal; Notable for the following components:      Result Value   Calcium 8.8 (*)    Total Protein 6.3 (*)    Total Bilirubin 0.2 (*)    All other components within normal limits  CBC WITH DIFFERENTIAL/PLATELET - Abnormal; Notable for the following components:   WBC 11.4 (*)    HCT 38.9 (*)    Neutro Abs 9.7 (*)    All other components within normal limits  URINALYSIS, ROUTINE W REFLEX MICROSCOPIC - Abnormal; Notable for the following components:   Specific Gravity, Urine <1.005 (*)    All other components within normal limits  RAPID URINE DRUG SCREEN, HOSP PERFORMED - Abnormal; Notable for the following components:   Tetrahydrocannabinol POSITIVE (*)    All other components within normal limits  RESP PANEL BY RT-PCR (FLU A&B, COVID) ARPGX2  CULTURE, BLOOD (ROUTINE X 2)  CULTURE, BLOOD (ROUTINE X 2)  LIPASE, BLOOD  LACTIC ACID, PLASMA  TROPONIN I (HIGH SENSITIVITY)  TROPONIN I (HIGH SENSITIVITY)    EKG EKG Interpretation  Date/Time:  Thursday September 18 2020 09:13:31 EDT Ventricular Rate:  99 PR Interval:  127 QRS Duration: 78 QT Interval:  317 QTC Calculation: 407 R Axis:   79 Text Interpretation: Sinus rhythm Borderline repolarization abnormality agree, no sig change from previous Confirmed by Arby Barrette [phone]) on 09/18/2020 10:07:32 AM  Radiology DG Chest 2 View  Result Date: 09/18/2020 CLINICAL DATA:  Chest pain EXAM: CHEST - 2 VIEW COMPARISON:  08/29/2020 FINDINGS: The heart size and mediastinal contours are within normal limits. Increased bilateral perihilar interstitial markings with subtle left lower lobe airspace opacity. Previously seen right upper lobe airspace opacity has resolved. No pleural effusion or pneumothorax. The visualized skeletal structures are unremarkable. IMPRESSION: Increased bilateral perihilar interstitial markings with subtle left lower lobe airspace opacity suspicious for developing pneumonia.  Electronically Signed   By: Duanne Guess D.O.   On: 09/18/2020 10:12   CT Angio Chest PE W/Cm &/Or Wo Cm  Result Date: 09/18/2020 CLINICAL DATA:  PE suspected, high prob. Recent pneumonia. Sharp chest pain. EXAM: CT ANGIOGRAPHY CHEST WITH CONTRAST TECHNIQUE: Multidetector CT imaging of the chest was performed using the standard protocol during bolus administration of intravenous contrast. Multiplanar CT image reconstructions and MIPs were obtained to evaluate the vascular anatomy. CONTRAST:  OMNIPAQUE IOHEXOL 350 MG/ML SOLN COMPARISON:  Chest x-ray earlier today FINDINGS: Cardiovascular: No filling defects in  the pulmonary arteries to suggest pulmonary emboli. Heart is normal size. Aorta is normal caliber. Mediastinum/Nodes: Mediastinal and bilateral hilar adenopathy. Index subcarinal lymph node has a short axis diameter of 16 mm. Index right hilar lymph node has a short axis diameter of 17 mm. Index left hilar lymph node has a short axis diameter of 11 mm. No axillary adenopathy. Lungs/Pleura: Clustered tree-in-bud nodular densities noted in the upper lobes bilaterally, right greater than left. No confluent opacities otherwise. No effusions. Upper Abdomen: Imaging into the upper abdomen demonstrates no acute findings. Musculoskeletal: Chest wall soft tissues are unremarkable. No acute bony abnormality. Review of the MIP images confirms the above findings. IMPRESSION: No evidence of pulmonary embolus. Mediastinal and bilateral hilar adenopathy. This is nonspecific and could be reactive, related to inflammatory process such as sarcoidosis, or lymphoma. Tree-in-bud nodular densities in the upper lobes bilaterally, right greater than left most compatible with small airways disease/alveolitis. No confluent areas of consolidation. Electronically Signed   By: Charlett Nose M.D.   On: 09/18/2020 11:33    Procedures Procedures   Medications Ordered in ED Medications  0.9 %  sodium chloride infusion (  Intravenous New Bag/Given 09/18/20 1122)  iohexol (OMNIPAQUE) 350 MG/ML injection 100 mL (100 mLs Intravenous Contrast Given 09/18/20 1117)    ED Course  I have reviewed the triage vital signs and the nursing notes.  Pertinent labs & imaging results that were available during my care of the patient were reviewed by me and considered in my medical decision making (see chart for details).    MDM Rules/Calculators/A&P                          Patient presents as outlined.  He took a 5-day course of Zithromax and Augmentin.  He is clinically well in appearance but continues to have some chest pain.  No signs of SIRS.  Patient does not have any hypoxia.  PE ruled out.  CT does show some findings of lymphadenopathy to include sarcoidosis and lymphoma within the differential.  I have been awaiting consultation call from pulmonary medicine.  At this time, patient needs to leave the hospital to assist in caring for his grandfather, and advises he cannot wait to get the remainder of recommendations from specialty consult at this time.  Currently I will opt to put the patient on a 10-day course of doxycycline and have made ambulatory referral to pulmonology for follow-up.  I have reviewed return precautions with the patient and stressed the need for follow-up to get further diagnostic evaluation for persistent pneumonia changes and possible sarcoidosis or lymphoma.  Clinically, the patient is very well in appearance and vital signs are stable.  Safe for discharge with close follow-up plan in place.  I will continue to try to review the case with pulmonology if any adjustments are needed to forward to the patient.  Final Clinical Impression(s) / ED Diagnoses Final diagnoses:  Community acquired pneumonia, unspecified laterality  Hilar lymphadenopathy   Consult: 13: 24 reviewed with Dr. Katrinka Blazing pulmonology.  Agrees with plan for doxycycline and follow-up in pulmonary outpatient clinic. Rx / DC Orders ED Discharge  Orders          Ordered    doxycycline (VIBRAMYCIN) 100 MG capsule  2 times daily        09/18/20 1306    Ambulatory referral to Pulmonology        09/18/20 1306  Arby Barrette, MD 09/18/20 1317    Arby Barrette, MD 09/18/20 1325

## 2020-09-18 NOTE — Discharge Instructions (Addendum)
1.  Your CT scan shows continued areas of infection in both lungs.  There are also inflamed lymph nodes which may be due to infection but also could be due to more serious conditions such as lymphoma or sarcoidosis. 2.  You have been given a prescription of antibiotics to take for 10 days.  Start this prescription.  You may take Tylenol or ibuprofen for pain if needed. 3.  Call [facility] to schedule a follow-up appointment within a week to 10 days.  A referral has been made through the emergency department. 4.  Return to the emergency department if you develop any worsening shortness of breath, chest pain, lightheadedness or other concerning symptoms.

## 2020-09-18 NOTE — ED Triage Notes (Signed)
Dx with pnumonia  3 weeks , states finsished his meds  but then he felt like he had something in his chest , no cough no sorethroat ,

## 2020-09-19 ENCOUNTER — Encounter: Payer: Self-pay | Admitting: Student

## 2020-09-23 LAB — CULTURE, BLOOD (ROUTINE X 2)
Culture: NO GROWTH
Culture: NO GROWTH
Specimen Description: ADEQUATE

## 2020-10-01 NOTE — Progress Notes (Addendum)
 Synopsis: Referred for mediastinal lymphadenopathy by Pfeiffer, Marcy, MD  Subjective:   PATIENT ID: Ian Walker GENDER: male DOB: 09/15/1985, MRN: [id number]  Chief Complaint  Patient presents with   Consult    Patient reports that he is some better,     35yM with history of smoking (currently half ppd - 9py), GERD on and off who had PNA diagnosed 08/29/20 at which time he had fever, myalgias/arthralgias, cough, and persistent central achy and pleuritic chest pain which failed to respond to NSAIDs with RUL infiltrate on CXR. He was given course of azithromycin and augmentin with some improvement but later presented to ED 09/18/20 for chest pain and this time dyspnea, then prescribed course of doxycycline and referred to our clinic with some concern for sarcoidosis. Never had a rash.   He has not had any unintentional weight loss, vision changes, photophobia, lightheadedness/dizziness, palpitations.      Otherwise pertinent review of systems is negative.  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 Place is full of metal/grease particulates, doesn't wear a mask. Som grass cutting occasionally. No pets/birds at home. He has never lived outside of [place].  MGM has RA but otherwise no autoimmune issues. No family history of lung disease.  No allergies to medications.     Past Medical History:  Diagnosis Date   GERD (gastroesophageal reflux disease)      No family history on file.   Past Surgical History:  Procedure Laterality Date   WISDOM TOOTH EXTRACTION      Social History   Socioeconomic History   Marital status: Single    Spouse name: Not on file   Number of children: Not on file   Years of education: Not on file   Highest education level: Not on file  Occupational History   Not on file  Tobacco Use   Smoking status: Some Days    Packs/day: 0.50    Types: Cigarettes    Start date: 02/16/2004   Smokeless tobacco: Never    Tobacco comments:    Patient is cutting back on smoking 10/02/20. HM.  Vaping Use   Vaping Use: Never used  Substance and Sexual Activity   Alcohol use: Yes    Comment: weekly   Drug use: Yes    Types: Marijuana   Sexual activity: Not on file  Other Topics Concern   Not on file  Social History Narrative   Not on file   Social Determinants of Health   Financial Resource Strain: Not on file  Food Insecurity: Not on file  Transportation Needs: Not on file  Physical Activity: Not on file  Stress: Not on file  Social Connections: Not on file  Intimate Partner Violence: Not on file     No Known Allergies   Outpatient Medications Prior to Visit  Medication Sig Dispense Refill   Multiple Vitamins-Minerals (MULTIVITAMIN WITH MINERALS) tablet Take 1 tablet by mouth daily.     azithromycin (ZITHROMAX) 250 MG tablet Take first 2 tablets by mouth together, then 1 tablet every day until finished. (Patient not taking: Reported on 10/02/2020) 6 tablet 0   doxycycline (VIBRAMYCIN) 100 MG capsule Take 1 capsule (100 mg total) by mouth 2 (two) times daily. One po bid x 7 days (Patient not taking: Reported on 10/02/2020) 20 capsule 0   ondansetron (ZOFRAN ODT) 4 MG disintegrating tablet Take 1 tablet (4 mg total) by mouth every 8 (eight) hours as needed   for nausea or vomiting. (Patient not taking: Reported on 10/02/2020) 20 tablet 0   No facility-administered medications prior to visit.       Objective:   Physical Exam:  General appearance: 35 y.o., male, NAD, conversant  Eyes: anicteric sclerae, moist conjunctivae; no lid-lag; PERRL, tracking appropriately HENT: NCAT; oropharynx, MMM, no mucosal ulcerations; normal hard and soft palate Neck: Trachea midline; no lymphadenopathy, no JVD Lungs: CTAB, no crackles, no wheeze, with normal respiratory effort CV: RRR, no MRGs  Abdomen: Soft, non-tender; non-distended, BS present  Extremities: No peripheral edema, radial and DP pulses present  bilaterally  Skin: Normal temperature, turgor and texture; no rash Psych: Appropriate affect Neuro: Alert and oriented to person and place, no focal deficit    Vitals:   10/02/20 1446  BP: 100/64  Pulse: 72  SpO2: 100%  Weight: 169 lb (76.7 kg)  Height: 6' 1" (1.854 m)   100% on RA BMI Readings from Last 3 Encounters:  10/02/20 22.30 kg/m  09/18/20 21.17 kg/m  08/29/20 21.77 kg/m   Wt Readings from Last 3 Encounters:  10/02/20 169 lb (76.7 kg)  09/18/20 164 lb 14.5 oz (74.8 kg)  08/29/20 165 lb (74.8 kg)     CBC    Component Value Date/Time   WBC 11.4 (H) 09/18/2020 1040   RBC 4.35 09/18/2020 1040   HGB 13.0 09/18/2020 1040   HCT 38.9 (L) 09/18/2020 1040   PLT 256 09/18/2020 1040   MCV 89.4 09/18/2020 1040   MCH 29.9 09/18/2020 1040   MCHC 33.4 09/18/2020 1040   RDW 12.4 09/18/2020 1040   LYMPHSABS 0.8 09/18/2020 1040   MONOABS 0.7 09/18/2020 1040   EOSABS 0.1 09/18/2020 1040   BASOSABS 0.0 09/18/2020 1040      Chest Imaging: CXR 08/29/20 reviewed by me and remarkable for RUL infiltrate  CTA Chest 09/18/20 reviewed by me and remarkable for mediastinal LAD, multiple splenic hypodensities, right and left upper lobe TIB nodularity   Pulmonary Functions Testing Results: No flowsheet data found.      Assessment & Plan:   # Mediastinal LAD: # tree-in-bud nodules:  # splenic hypodensities: Suspicion is highest for sarcoid however lymphoma or alternative lymphoproliferative process in differential. Does also have some interesting metal dust exposures at work.  # Smoking: has never tried to quit. Currently thinking about quitting.   Plan: - HIV - plan for EBUS and flexible bronchoscopy/BAL under general anesthesia, no pre-op necessary. Will obtain EBNA material for cytology, micro (AFB, fungal cultures), and flow. BAL send for cell count/diff and cultures.  - smoking cessation recommended, he will work on it on his own and we will revisit at his next clinic  appointment      Dequane Strahan M Holten Spano, MD [facility] 10/02/2020 3:34 PM   

## 2020-10-01 NOTE — H&P (View-Only) (Signed)
Synopsis: Referred for mediastinal lymphadenopathy by Charlesetta Shanks, MD  Subjective:   PATIENT ID: Ian Walker GENDER: male DOB: June 12, 1985, MRN: [id number]  Chief Complaint  Patient presents with   Consult    Patient reports that he is some better,     35yM with history of smoking (currently half ppd - 9py), GERD on and off who had PNA diagnosed 08/29/20 at which time he had fever, myalgias/arthralgias, cough, and persistent central achy and pleuritic chest pain which failed to respond to NSAIDs with RUL infiltrate on CXR. He was given course of azithromycin and augmentin with some improvement but later presented to ED 09/18/20 for chest pain and this time dyspnea, then prescribed course of doxycycline and referred to our clinic with some concern for sarcoidosis. Never had a rash.   He has not had any unintentional weight loss, vision changes, photophobia, lightheadedness/dizziness, palpitations.      Otherwise pertinent review of systems is negative.  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 Place is full of metal/grease particulates, doesn't wear a mask. Som grass cutting occasionally. No pets/birds at home. He has never lived outside of [place].  MGM has RA but otherwise no autoimmune issues. No family history of lung disease.  No allergies to medications.     Past Medical History:  Diagnosis Date   GERD (gastroesophageal reflux disease)      No family history on file.   Past Surgical History:  Procedure Laterality Date   WISDOM TOOTH EXTRACTION      Social History   Socioeconomic History   Marital status: Single    Spouse name: Not on file   Number of children: Not on file   Years of education: Not on file   Highest education level: Not on file  Occupational History   Not on file  Tobacco Use   Smoking status: Some Days    Packs/day: 0.50    Types: Cigarettes    Start date: 02/16/2004   Smokeless tobacco: Never    Tobacco comments:    Patient is cutting back on smoking 10/02/20. HM.  Vaping Use   Vaping Use: Never used  Substance and Sexual Activity   Alcohol use: Yes    Comment: weekly   Drug use: Yes    Types: Marijuana   Sexual activity: Not on file  Other Topics Concern   Not on file  Social History Narrative   Not on file   Social Determinants of Health   Financial Resource Strain: Not on file  Food Insecurity: Not on file  Transportation Needs: Not on file  Physical Activity: Not on file  Stress: Not on file  Social Connections: Not on file  Intimate Partner Violence: Not on file     No Known Allergies   Outpatient Medications Prior to Visit  Medication Sig Dispense Refill   Multiple Vitamins-Minerals (MULTIVITAMIN WITH MINERALS) tablet Take 1 tablet by mouth daily.     azithromycin (ZITHROMAX) 250 MG tablet Take first 2 tablets by mouth together, then 1 tablet every day until finished. (Patient not taking: Reported on 10/02/2020) 6 tablet 0   doxycycline (VIBRAMYCIN) 100 MG capsule Take 1 capsule (100 mg total) by mouth 2 (two) times daily. One po bid x 7 days (Patient not taking: Reported on 10/02/2020) 20 capsule 0   ondansetron (ZOFRAN ODT) 4 MG disintegrating tablet Take 1 tablet (4 mg total) by mouth every 8 (eight) hours as needed  for nausea or vomiting. (Patient not taking: Reported on 10/02/2020) 20 tablet 0   No facility-administered medications prior to visit.       Objective:   Physical Exam:  General appearance: 35 y.o., male, NAD, conversant  Eyes: anicteric sclerae, moist conjunctivae; no lid-lag; PERRL, tracking appropriately HENT: NCAT; oropharynx, MMM, no mucosal ulcerations; normal hard and soft palate Neck: Trachea midline; no lymphadenopathy, no JVD Lungs: CTAB, no crackles, no wheeze, with normal respiratory effort CV: RRR, no MRGs  Abdomen: Soft, non-tender; non-distended, BS present  Extremities: No peripheral edema, radial and DP pulses present  bilaterally  Skin: Normal temperature, turgor and texture; no rash Psych: Appropriate affect Neuro: Alert and oriented to person and place, no focal deficit    Vitals:   10/02/20 1446  BP: 100/64  Pulse: 72  SpO2: 100%  Weight: 169 lb (76.7 kg)  Height: _0  (1.854 m)   100% on RA BMI Readings from Last 3 Encounters:  10/02/20 22.30 kg/m  09/18/20 21.17 kg/m  08/29/20 21.77 kg/m   Wt Readings from Last 3 Encounters:  10/02/20 169 lb (76.7 kg)  09/18/20 164 lb 14.5 oz (74.8 kg)  08/29/20 165 lb (74.8 kg)     CBC    Component Value Date/Time   WBC 11.4 (H) 09/18/2020 1040   RBC 4.35 09/18/2020 1040   HGB 13.0 09/18/2020 1040   HCT 38.9 (L) 09/18/2020 1040   PLT 256 09/18/2020 1040   MCV 89.4 09/18/2020 1040   MCH 29.9 09/18/2020 1040   MCHC 33.4 09/18/2020 1040   RDW 12.4 09/18/2020 1040   LYMPHSABS 0.8 09/18/2020 1040   MONOABS 0.7 09/18/2020 1040   EOSABS 0.1 09/18/2020 1040   BASOSABS 0.0 09/18/2020 1040      Chest Imaging: CXR 08/29/20 reviewed by me and remarkable for RUL infiltrate  CTA Chest 09/18/20 reviewed by me and remarkable for mediastinal LAD, multiple splenic hypodensities, right and left upper lobe TIB nodularity   Pulmonary Functions Testing Results: No flowsheet data found.      Assessment & Plan:   # Mediastinal LAD: # tree-in-bud nodules:  # splenic hypodensities: Suspicion is highest for sarcoid however lymphoma or alternative lymphoproliferative process in differential. Does also have some interesting metal dust exposures at work.  # Smoking: has never tried to quit. Currently thinking about quitting.   Plan: - HIV - plan for EBUS and flexible bronchoscopy/BAL under general anesthesia, no pre-op necessary. Will obtain EBNA material for cytology, micro (AFB, fungal cultures), and flow. BAL send for cell count/diff and cultures.  - smoking cessation recommended, he will work on it on his own and we will revisit at his next clinic  appointment      Maryjane Hurter, MD [facility] 10/02/2020 3:34 PM

## 2020-10-02 ENCOUNTER — Telehealth: Payer: Self-pay | Admitting: Student

## 2020-10-02 ENCOUNTER — Encounter: Payer: Self-pay | Admitting: Student

## 2020-10-02 ENCOUNTER — Ambulatory Visit (INDEPENDENT_AMBULATORY_CARE_PROVIDER_SITE_OTHER): Payer: BC Managed Care – PPO | Admitting: Student

## 2020-10-02 ENCOUNTER — Other Ambulatory Visit: Payer: Self-pay

## 2020-10-02 VITALS — BP 100/64 | HR 72 | Ht 73.0 in | Wt 169.0 lb

## 2020-10-02 DIAGNOSIS — R590 Localized enlarged lymph nodes: Secondary | ICD-10-CM

## 2020-10-02 DIAGNOSIS — J219 Acute bronchiolitis, unspecified: Secondary | ICD-10-CM | POA: Diagnosis not present

## 2020-10-02 NOTE — Telephone Encounter (Signed)
I scheduled pt for EBUS at [facility] on 9/2 at 7:30.  Dr Thora Lance needs to be set up in epic for scheduling at Endo so appt info is showing for RB right now until scheduling can get NM added.  Should be corrected by tomorrow.  Pt will go for covid test on 8/30.  Gave appt info to pt.

## 2020-10-02 NOTE — Addendum Note (Signed)
Addended by: Arvilla Market on: 10/02/2020 04:13 PM   Modules accepted: Orders

## 2020-10-02 NOTE — Addendum Note (Signed)
Addended by: Arvilla Market on: 10/02/2020 04:16 PM   Modules accepted: Orders

## 2020-10-02 NOTE — Patient Instructions (Addendum)
-   Head to the lab today - We will plan for EBUS, bronchoscopy and bronchoalveolar lavage under general anesthesia hopefully next Wednesday. Our schedulers will arrange this as well as preop covid testing - No food or drink after midnight the night before the procedure

## 2020-10-03 ENCOUNTER — Other Ambulatory Visit: Payer: BC Managed Care – PPO

## 2020-10-03 DIAGNOSIS — R590 Localized enlarged lymph nodes: Secondary | ICD-10-CM

## 2020-10-06 LAB — HIV ANTIBODY (ROUTINE TESTING W REFLEX): HIV 1&2 Ab, 4th Generation: NONREACTIVE

## 2020-10-15 ENCOUNTER — Other Ambulatory Visit: Payer: Self-pay | Admitting: Emergency Medicine

## 2020-10-15 ENCOUNTER — Telehealth: Payer: Self-pay | Admitting: Emergency Medicine

## 2020-10-15 NOTE — Telephone Encounter (Signed)
Pt aware of covid location

## 2020-10-15 NOTE — Telephone Encounter (Signed)
Called patient but he did not answer. His VM is full so I could not leave a message.   I spoke with one of the PCCs. He can go today to get his covid test but it MUST be done before 3pm and has to be done at the pre-procedural testing site.

## 2020-10-16 ENCOUNTER — Encounter (HOSPITAL_COMMUNITY): Payer: Self-pay | Admitting: Student

## 2020-10-16 ENCOUNTER — Other Ambulatory Visit: Payer: Self-pay

## 2020-10-16 LAB — SARS CORONAVIRUS 2 (TAT 6-24 HRS): SARS Coronavirus 2: NEGATIVE

## 2020-10-16 NOTE — Anesthesia Preprocedure Evaluation (Addendum)
Anesthesia Evaluation  Patient identified by MRN, date of birth, ID band Patient awake    Reviewed: Allergy & Precautions, NPO status , Patient's Chart, lab work & pertinent test results  Airway Mallampati: I  TM Distance: >3 FB Neck ROM: Full    Dental  (+) Poor Dentition, Dental Advisory Given   Pulmonary former smoker,  Mediastinal lymphadenopathy   Pulmonary exam normal breath sounds clear to auscultation       Cardiovascular negative cardio ROS Normal cardiovascular exam Rhythm:Regular Rate:Normal     Neuro/Psych negative neurological ROS  negative psych ROS   GI/Hepatic GERD  ,(+)     substance abuse  alcohol use and marijuana use,   Endo/Other  negative endocrine ROS  Renal/GU negative Renal ROS  negative genitourinary   Musculoskeletal negative musculoskeletal ROS (+)   Abdominal   Peds  Hematology negative hematology ROS (+)   Anesthesia Other Findings   Reproductive/Obstetrics                           Anesthesia Physical Anesthesia Plan  ASA: 2  Anesthesia Plan: General   Post-op Pain Management:    Induction: Intravenous  PONV Risk Score and Plan: 2 and Treatment may vary due to age or medical condition, Midazolam and Ondansetron  Airway Management Planned: Oral ETT  Additional Equipment:   Intra-op Plan:   Post-operative Plan: Extubation in OR  Informed Consent: I have reviewed the patients History and Physical, chart, labs and discussed the procedure including the risks, benefits and alternatives for the proposed anesthesia with the patient or authorized representative who has indicated his/her understanding and acceptance.     Dental advisory given  Plan Discussed with: CRNA and Anesthesiologist  Anesthesia Plan Comments:        Anesthesia Quick Evaluation

## 2020-10-16 NOTE — Progress Notes (Addendum)
Ian Walker denies chest pain or shortness of breath.  Patient had a Covid test on 10/15/20- it was negative.  Ian Walker does not have a PCP.

## 2020-10-17 ENCOUNTER — Encounter (HOSPITAL_COMMUNITY)
Admission: RE | Disposition: A | Discharge status: Home / Self Care | Payer: Self-pay | Source: Home / Self Care | Attending: Student

## 2020-10-17 ENCOUNTER — Ambulatory Visit (HOSPITAL_COMMUNITY)
Admission: RE | Admit: 2020-10-17 | Discharge: 2020-10-17 | Disposition: A | Discharge status: Home / Self Care | Payer: BC Managed Care – PPO | Attending: Student | Admitting: Student

## 2020-10-17 ENCOUNTER — Ambulatory Visit (HOSPITAL_COMMUNITY): Payer: BC Managed Care – PPO | Admitting: Anesthesiology

## 2020-10-17 ENCOUNTER — Ambulatory Visit (HOSPITAL_COMMUNITY): Payer: BC Managed Care – PPO

## 2020-10-17 DIAGNOSIS — F17210 Nicotine dependence, cigarettes, uncomplicated: Secondary | ICD-10-CM | POA: Diagnosis not present

## 2020-10-17 DIAGNOSIS — J219 Acute bronchiolitis, unspecified: Secondary | ICD-10-CM | POA: Insufficient documentation

## 2020-10-17 DIAGNOSIS — R846 Abnormal cytological findings in specimens from respiratory organs and thorax: Secondary | ICD-10-CM | POA: Diagnosis not present

## 2020-10-17 DIAGNOSIS — Z9889 Other specified postprocedural states: Secondary | ICD-10-CM

## 2020-10-17 DIAGNOSIS — R590 Localized enlarged lymph nodes: Secondary | ICD-10-CM | POA: Diagnosis present

## 2020-10-17 HISTORY — PX: VIDEO BRONCHOSCOPY WITH ENDOBRONCHIAL ULTRASOUND: SHX6177

## 2020-10-17 HISTORY — PX: BRONCHIAL NEEDLE ASPIRATION BIOPSY: SHX5106

## 2020-10-17 HISTORY — DX: Other psychoactive substance abuse, uncomplicated: F19.10

## 2020-10-17 HISTORY — PX: BRONCHIAL WASHINGS: SHX5105

## 2020-10-17 LAB — BODY FLUID CELL COUNT WITH DIFFERENTIAL
Eos, Fluid: 2 %
Lymphs, Fluid: 5 %
Monocyte-Macrophage-Serous Fluid: 3 % — ABNORMAL LOW (ref 50–90)
Neutrophil Count, Fluid: 90 % — ABNORMAL HIGH (ref 0–25)
Total Nucleated Cell Count, Fluid: 213 cu mm (ref 0–1000)

## 2020-10-17 SURGERY — BRONCHOSCOPY, WITH EBUS
Anesthesia: General

## 2020-10-17 MED ORDER — SUGAMMADEX SODIUM 200 MG/2ML IV SOLN
INTRAVENOUS | Status: DC | PRN
  Administered 2020-10-17: 200 mg via INTRAVENOUS

## 2020-10-17 MED ORDER — ONDANSETRON HCL 4 MG/2ML IJ SOLN
INTRAMUSCULAR | Status: DC | PRN
  Administered 2020-10-17: 4 mg via INTRAVENOUS

## 2020-10-17 MED ORDER — LIDOCAINE 2% (20 MG/ML) 5 ML SYRINGE
INTRAMUSCULAR | Status: DC | PRN
  Administered 2020-10-17: 60 mg via INTRAVENOUS
  Administered 2020-10-17: 100 mg via INTRAVENOUS

## 2020-10-17 MED ORDER — LACTATED RINGERS IV SOLN: INTRAVENOUS | Status: DC

## 2020-10-17 MED ORDER — ONDANSETRON HCL 4 MG/2ML IJ SOLN
4.0000 mg | Freq: Once | INTRAMUSCULAR | Status: DC | PRN
Start: 2020-10-17 — End: 2020-10-17

## 2020-10-17 MED ORDER — MIDAZOLAM HCL 2 MG/2ML IJ SOLN
INTRAMUSCULAR | Status: AC
  Filled 2020-10-17: qty 2

## 2020-10-17 MED ORDER — FENTANYL CITRATE (PF) 100 MCG/2ML IJ SOLN: 25.0000 ug | INTRAMUSCULAR | Status: DC | PRN

## 2020-10-17 MED ORDER — ROCURONIUM BROMIDE 10 MG/ML (PF) SYRINGE
PREFILLED_SYRINGE | INTRAVENOUS | Status: DC | PRN
  Administered 2020-10-17: 60 mg via INTRAVENOUS

## 2020-10-17 MED ORDER — PROPOFOL 10 MG/ML IV BOLUS
INTRAVENOUS | Status: DC | PRN
  Administered 2020-10-17: 200 mg via INTRAVENOUS

## 2020-10-17 MED ORDER — MIDAZOLAM HCL 5 MG/5ML IJ SOLN
INTRAMUSCULAR | Status: DC | PRN
  Administered 2020-10-17: 2 mg via INTRAVENOUS

## 2020-10-17 NOTE — Op Note (Signed)
Flexible and EBUS Bronchoscopy Procedure Note  Ian Walker  [id number]  1985/08/06  Date:10/17/20  Time:9:19 AM   Provider Performing:Dorena Dorfman M [name]   Procedure: Flexible bronchoscopy and EBUS Bronchoscopy  Indication(s) Mediastinal Lymphadenopathy Bronchiolitis  Consent Risks of the procedure as well as the alternatives and risks of each were explained to the patient and/or caregiver.  Consent for the procedure was obtained.  Anesthesia General Anesthesia   Time Out Verified patient identification, verified procedure, site/side was marked, verified correct patient position, special equipment/implants available, medications/allergies/relevant history reviewed, required imaging and test results available.   Sterile Technique Usual hand hygiene, masks, gowns, and gloves were used   Procedure Description Diagnostic bronchoscope advanced through endotracheal tube and into airway.  Airways were examined down to subsegmental level with findings noted below.  Following diagnostic evaluation, BAL performed in RUL anterior segment with 120cc saline instilled and 40cc cloudy return. The diagnostic bronchoscope was then removed and the EBUS bronchoscope was advanced into airway with stations 7S, 11R, 4R biopsied and sent for slide, cell block, and/or culture.  The EBUS bronchoscope was removed after assuring no active bleeding from biopsy site.  Findings: Airway exam unremarkable save for scattered whitish secretions which were easily suctioned, airway pitting noted in most segmental bronchi suggestive of chronic bronchitis.    Complications/Tolerance None; patient tolerated the procedure well. Chest X-ray is not needed post procedure.   EBL Minimal   Specimen(s) 7S sent for cyto, micro, flow if indicated after pathology review 11R, 4R sent for cyto   BAL sent for cyto, micro, cell count/diff

## 2020-10-17 NOTE — Interval H&P Note (Signed)
History and Physical Interval Note:  10/17/2020 7:07 AM  Ian Walker  has presented today for surgery, with the diagnosis of MEDIASTINAL LYMPHADENOPATHY.  The various methods of treatment have been discussed with the patient and family. After consideration of risks, benefits and other options for treatment, the patient has consented to  Procedure(s): VIDEO BRONCHOSCOPY WITH ENDOBRONCHIAL ULTRASOUND (N/A) as a surgical intervention.  The patient's history has been reviewed, patient examined, no change in status, stable for surgery.  I have reviewed the patient's chart and labs.  Questions were answered to the patient's satisfaction.     Omar Person

## 2020-10-17 NOTE — Discharge Instructions (Signed)
It will take 3-5 business days for most results and I'll give you a call to discuss. Some of the cultures (to look for infection) can however take up to 6 weeks to be finalized. Fever is common in the first 1-2 days after bronchoscopy - just take tylenol. Coughing up bloody streaks or small clots of blood is also pretty typical. If you're filling up half a cup with bloody material then send me a mychart message or call our clinic at [phone] and we can help guide you where to go to take care of it.

## 2020-10-17 NOTE — Anesthesia Procedure Notes (Signed)
Procedure Name: Intubation Date/Time: 10/17/2020 7:37 AM Performed by: Lynnell Chad, CRNA Pre-anesthesia Checklist: Patient identified, Emergency Drugs available, Suction available and Patient being monitored Patient Re-evaluated:Patient Re-evaluated prior to induction Oxygen Delivery Method: Circle System Utilized Preoxygenation: Pre-oxygenation with 100% oxygen Induction Type: IV induction Ventilation: Mask ventilation without difficulty Laryngoscope Size: Miller and 2 Grade View: Grade I Tube type: Oral Tube size: 8.5 mm Number of attempts: 1 Airway Equipment and Method: Stylet and Oral airway Placement Confirmation: ETT inserted through vocal cords under direct vision, positive ETCO2 and breath sounds checked- equal and bilateral Secured at: 22 cm Tube secured with: Tape Dental Injury: Teeth and Oropharynx as per pre-operative assessment

## 2020-10-17 NOTE — Anesthesia Postprocedure Evaluation (Signed)
Anesthesia Post Note  Patient: Ian Walker  Procedure(s) Performed: VIDEO BRONCHOSCOPY WITH ENDOBRONCHIAL ULTRASOUND BRONCHIAL WASHINGS BRONCHIAL NEEDLE ASPIRATION BIOPSIES     Patient location during evaluation: PACU Anesthesia Type: General Level of consciousness: awake and alert Pain management: pain level controlled Vital Signs Assessment: post-procedure vital signs reviewed and stable Respiratory status: spontaneous breathing, nonlabored ventilation and respiratory function stable Cardiovascular status: blood pressure returned to baseline and stable Postop Assessment: no apparent nausea or vomiting Anesthetic complications: no   No notable events documented.  Last Vitals:  Vitals:   10/17/20 0915 10/17/20 0927  BP: 128/90 134/84  Pulse: 70 69  Resp: 12 11  Temp:  (!) 36.4 C  SpO2: 97% 96%    Last Pain: There were no vitals filed for this visit.               [name] A.

## 2020-10-17 NOTE — Transfer of Care (Signed)
Immediate Anesthesia Transfer of Care Note  Patient: Ian Walker  Procedure(s) Performed: VIDEO BRONCHOSCOPY WITH ENDOBRONCHIAL ULTRASOUND BRONCHIAL WASHINGS BRONCHIAL NEEDLE ASPIRATION BIOPSIES  Patient Location: PACU  Anesthesia Type:General  Level of Consciousness: drowsy and patient cooperative  Airway & Oxygen Therapy: Patient Spontanous Breathing  Post-op Assessment: Report given to RN and Post -op Vital signs reviewed and stable  Post vital signs: Reviewed and stable  Last Vitals:  Vitals Value Taken Time  BP 132/84 10/17/20 0901  Temp 36.4 C 10/17/20 0900  Pulse 83 10/17/20 0904  Resp 17 10/17/20 0904  SpO2 100 % 10/17/20 0904  Vitals shown include unvalidated device data.  Last Pain: There were no vitals filed for this visit.       Complications: No notable events documented.

## 2020-10-18 LAB — ACID FAST SMEAR (AFB, MYCOBACTERIA)
Acid Fast Smear: NEGATIVE
Acid Fast Smear: NEGATIVE

## 2020-10-19 LAB — CULTURE, RESPIRATORY W GRAM STAIN
Culture: NO GROWTH
Gram Stain: NONE SEEN

## 2020-10-20 ENCOUNTER — Encounter (HOSPITAL_COMMUNITY): Payer: Self-pay | Admitting: Student

## 2020-10-22 LAB — ANAEROBIC CULTURE W GRAM STAIN

## 2020-10-22 LAB — AEROBIC/ANAEROBIC CULTURE W GRAM STAIN (SURGICAL/DEEP WOUND): Culture: NO GROWTH

## 2020-10-22 LAB — CYTOLOGY - NON PAP

## 2020-10-23 ENCOUNTER — Telehealth: Payer: Self-pay | Admitting: Student

## 2020-10-23 NOTE — Telephone Encounter (Signed)
Attempted to call to discuss bronchoscopy results but could only leave voicemail with call back number. Nothing came back to support diagnosis of sarcoid and there wasn't any evidence of lymphoma or other cancer. Unsure if this still represents sarcoid but lavage differential was neutrophilic and EBNA had no granulomas, HP or berylliosis related to workplace exposure (metal/other dusts) but again no granulomas on EBNA. NTM infx possible, AFB pending. Lymphoma still a possibility. Would recommend getting PFTs now regardless and depending on symptom burden and PFTs would like to discuss options of watchful waiting with CT Chest in 3-6 months, repeat EBUS perhaps with PET/CT to guide potentially higher yield sampling, or referral for mediastinoscopy and surgical lung biopsy.

## 2020-10-29 ENCOUNTER — Telehealth: Payer: Self-pay | Admitting: Student

## 2020-10-29 NOTE — Telephone Encounter (Signed)
Attempted to call again x2 with results from EBUS but could only leave voicemail.

## 2020-10-30 NOTE — Telephone Encounter (Signed)
Call made to grandmother (emergency contact), confirmed patient DOB. Per grandmother he has been working overtime working 12 hours stating he does not take him phone in with him to work. I made her aware MD has been trying to contact him regarding his results.Number given to grandmother. She is going to have patient call us tomorrow.   Will route to NM as FYI.

## 2020-11-20 LAB — FUNGUS CULTURE WITH STAIN

## 2020-11-20 LAB — FUNGUS CULTURE RESULT

## 2020-11-20 LAB — FUNGAL ORGANISM REFLEX

## 2020-11-30 LAB — ACID FAST CULTURE WITH REFLEXED SENSITIVITIES (MYCOBACTERIA)
Acid Fast Culture: NEGATIVE
Acid Fast Culture: NEGATIVE

## 2021-05-04 ENCOUNTER — Ambulatory Visit (HOSPITAL_COMMUNITY)
Admission: EM | Admit: 2021-05-04 | Discharge: 2021-05-04 | Disposition: A | Discharge status: Home / Self Care | Payer: BC Managed Care – PPO

## 2021-05-04 DIAGNOSIS — F1120 Opioid dependence, uncomplicated: Secondary | ICD-10-CM

## 2021-05-04 NOTE — Discharge Instructions (Addendum)

## 2021-05-04 NOTE — ED Provider Notes (Signed)
[facility] Medical Screening Exam ? ?Patient Name: Ian Walker ?MRN: [id number] ?Date of Evaluation: 05/04/21 ?Chief Complaint:   ?Diagnosis:  ?Final diagnoses:  ?Uncomplicated opioid dependence ([place])  ? ? ?History of Present illness: Ian Walker is a 36 y.o. male patient presented to [facility] as a walk in requesting assistance with ongoing opiate use. ? ?Ian Walker, 36 y.o., male patient seen face to face by this provider, consulted with Dr. Serafina Mitchell; and chart reviewed on 05/04/21.  He reports a past history of opioid use. ? ?During evaluation Ian Walker is alert/oriented x4 and cooperative.  He makes good eye contact.  He is fairly groomed.  He speaking in a clear tone at moderate volume and normal pace.  He denies depression.  He denies any concerns with appetite or sleep.  He denies SI/HI/AVH.  He endorses some anxiety and has a anxious affect.  Reports most of his anxiety is due to worrying about where to get his Suboxone. States he has maintained his sobriety for over 2 years but after the death of his father in 12-Feb-2021 he relapsed. Patient reports using various opiates (snorting) daily until 3 months ago when he started using Suboxone off the street. Patient states he was using up to 16 mg daily of Suboxone although has reduced that dose down to 4 mg daily.e is currently buying Suboxone strips off the street and paying roughly $20 per prescription. Reports it is getting difficult to obtain medication from the streets and he is aware there are safety concerns and that medication may be tainted.  States he is willing to go to Suboxone clinic.  He is informed that Suboxone is not prescribed at this facility and he is requesting resources. ? ?At this time [name] is educated and verbalizes understanding of mental health resources and other crisis services in the community.  He is instructed to call 911 and present to the nearest emergency room should he  experience any suicidal/homicidal ideation, auditory/visual/hallucinations, or detrimental worsening of pain mental health condition.  He was a also advised by Probation officer that he could call the toll-free phone on insurance card to assist with identifying in network counselors and agencies or number on back of Medicaid card t speak with care coordinator ?  ? ?Psychiatric Specialty Exam ? ?Presentation  ?General Appearance:Appropriate for Environment ? ?Eye Contact:Good ? ?Speech:Clear and Coherent; Normal Rate ? ?Speech Volume:Normal ? ?Handedness:Right ? ? ?Mood and Affect  ?Mood:Anxious ? ?Affect:Congruent ? ? ?Thought Process  ?Thought Processes:Coherent ? ?Descriptions of Associations:Intact ? ?Orientation:Full (Time, Place and Person) ? ?Thought Content:Logical ?   Hallucinations:None ? ?Ideas of Reference:None ? ?Suicidal Thoughts:No ? ?Homicidal Thoughts:No ? ? ?Sensorium  ?Memory:Immediate Good; Recent Good; Remote Good ? ?Judgment:Good ? ?Insight:Good ? ? ?Executive Functions  ?Concentration:Good ? ?Attention Span:Good ? ?Recall:Good ? ?Fund of [place] ? ?Language:Good ? ? ?Psychomotor Activity  ?Psychomotor Activity:Normal ? ? ?Assets  ?Assets:Communication Skills; Desire for Improvement; Financial Resources/Insurance; Housing; Physical Health; Resilience; Social Support; Talents/Skills ? ? ?Sleep  ?Sleep:Good ? ?Number of hours: No data recorded ? ?No data recorded ? ?Physical Exam: ?Physical Exam ?Vitals and nursing note reviewed.  ?Constitutional:   ?   General: He is not in acute distress. ?   Appearance: Normal appearance. He is well-developed.  ?HENT:  ?   Head: Normocephalic and atraumatic.  ?Eyes:  ?   General:     ?   Right eye: No discharge.     ?  Left eye: No discharge.  ?   Conjunctiva/sclera: Conjunctivae normal.  ?Cardiovascular:  ?   Rate and Rhythm: Normal rate.  ?   Heart sounds: No murmur heard. ?Pulmonary:  ?   Effort: Pulmonary effort is normal. No respiratory distress.   ?Musculoskeletal:     ?   General: No swelling.  ?   Cervical back: Normal range of motion.  ?Skin: ?   Coloration: Skin is not jaundiced or pale.  ?Neurological:  ?   Mental Status: He is alert and oriented to person, place, and time.  ?Psychiatric:     ?   Attention and Perception: Attention and perception normal.     ?   Mood and Affect: Mood is anxious.     ?   Speech: Speech normal.     ?   Behavior: Behavior normal. Behavior is cooperative.     ?   Thought Content: Thought content normal.     ?   Cognition and Memory: Cognition normal.     ?   Judgment: Judgment normal.  ? ?Review of Systems  ?Constitutional: Negative.   ?HENT: Negative.    ?Eyes: Negative.   ?Respiratory: Negative.    ?Cardiovascular: Negative.   ?Musculoskeletal: Negative.   ?Skin: Negative.   ?Neurological: Negative.   ?Psychiatric/Behavioral:  Positive for substance abuse. The patient is nervous/anxious.   ?Blood pressure 137/90, pulse (!) 109, temperature 97.9 ?F (36.6 ?C), temperature source Oral, resp. rate 18, height 6' (1.829 m), weight 147 lb (66.7 kg), SpO2 100 %. Body mass index is 19.94 kg/m?. ? ?Musculoskeletal: ?Strength & Muscle Tone: within normal limits ?Gait & Station: normal ?Patient leans: N/A ? ? ?[facility] MSE Discharge Disposition for Follow up and Recommendations: ?Based on my evaluation the patient does not appear to have an emergency medical condition and can be discharged with resources and follow up care in outpatient services for Medication Management ? ?Discharge patient. ? ?Provided outpatient psychiatric resources including alcohol and drug services and [facility]. ? ?No evidence of imminent risk to self or others at present.    ?Patient does not meet criteria for psychiatric inpatient admission. ?Discussed crisis plan, support from social network, calling 911, coming to the Emergency Department, and calling Suicide Hotline.  ? ? ?Revonda Humphrey, NP ?05/04/2021, 7:22 PM ? ?

## 2021-05-04 NOTE — BH Assessment (Signed)
Patient is a 36 year old male that presents this date voluntary as a walk in to [facility] requesting assistance with ongoing opiate use. Patient denies any S/I, H/I or AVH. Patent states he had been maintaining his sobriety for over 2 years until the recent death of his father in 02/18/2021 at which time he relapsed. Patient reports using various opiates (snorting) daily until 3 months ago when he started using Suboxone off the street. Patient states he was using up to 16 mg daily of Suboxone although has reduced that dose down to 4 mg daily. Patient states he is having problems accessing that medication "off the street" and due to the high risk environment associated with getting that medication illegally is wanting "to do it the right way now" and is requesting information on area programs. Patient denies any mental health history.     ?

## 2021-06-03 ENCOUNTER — Telehealth (HOSPITAL_COMMUNITY): Payer: Self-pay | Admitting: Emergency Medicine

## 2021-06-03 NOTE — Telephone Encounter (Signed)
Patient called to report he is deleting messages in his voicemail box now. Patient reports he is able to take a return phone call now.

## 2021-06-03 NOTE — BH Assessment (Signed)
Care Management - [facility] Follow Up Discharges  ? ?Writer attempted to make contact with patient today and was unsuccessful.  Voicemail is full.   ? ?Per chart review, patient was provided with outpatient substance abuseresources. ? ?

## 2021-12-15 IMAGING — CT CT ANGIO CHEST
2 of 8 series · 19 of 36 positions shown · IV contrast (Omnipaque)
Comparison: Chest x-ray earlier today

CLINICAL DATA: PE suspected, high prob. Recent pneumonia. Sharp
chest pain.

EXAM:
CT ANGIOGRAPHY CHEST WITH CONTRAST
TECHNIQUE: Multidetector CT imaging of the chest was performed using the
standard protocol during bolus administration of intravenous
contrast. Multiplanar CT image reconstructions and MIPs were
obtained to evaluate the vascular anatomy.
CONTRAST:  100mL OMNIPAQUE IOHEXOL 350 MG/ML SOLN

[Series 6: pe thins · axial · 0.79mm/px · z∈[-303,-34]mm · 18 of 301 slices shown]
[im 16/301  lung]
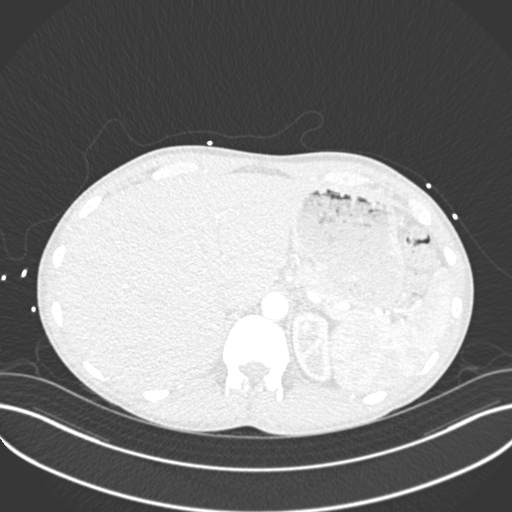
[im 32/301  mediastinal]
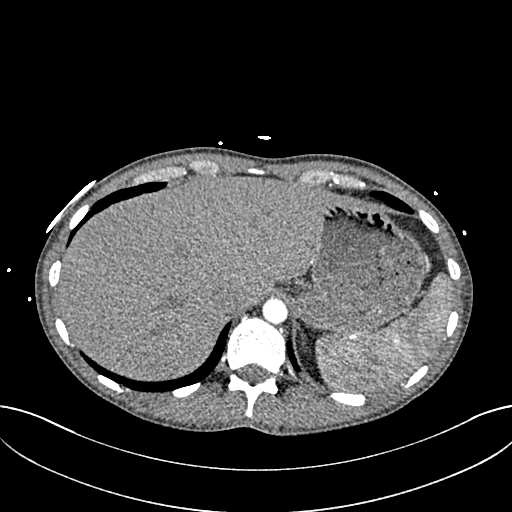
[im 48/301  lung]
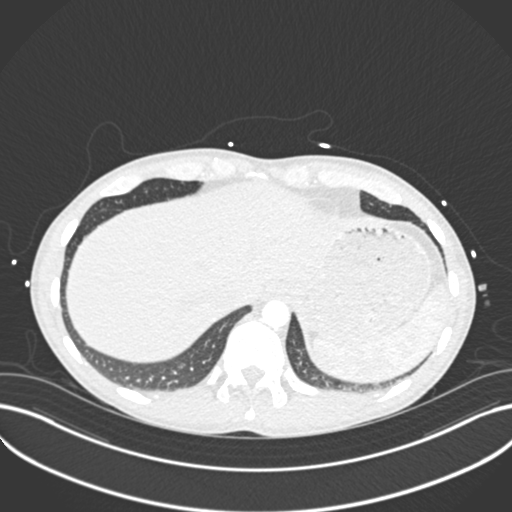
[im 64/301  mediastinal]
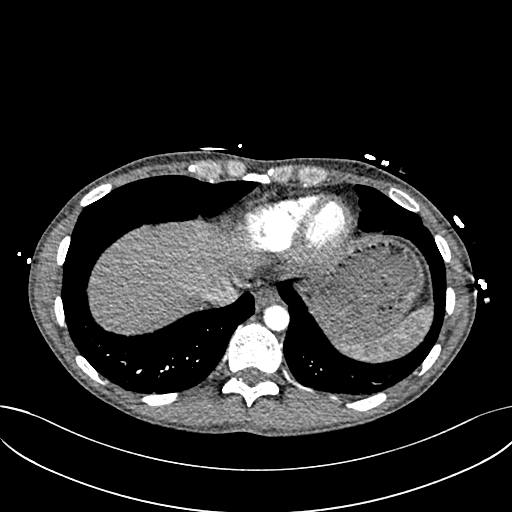
[im 79/301  lung]
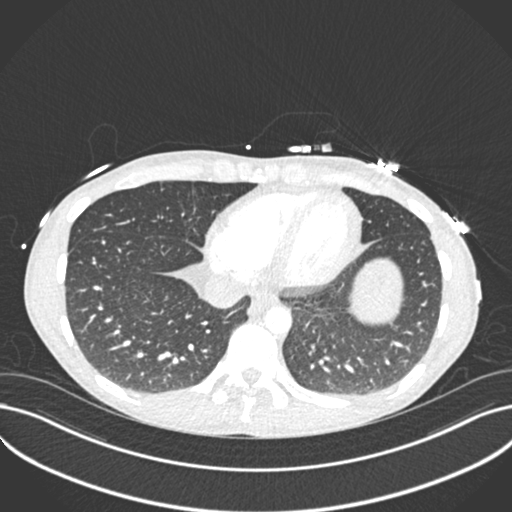
[im 95/301  mediastinal]
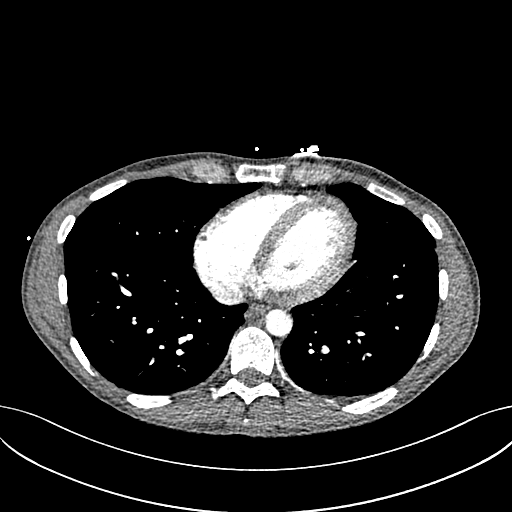
[im 111/301  lung]
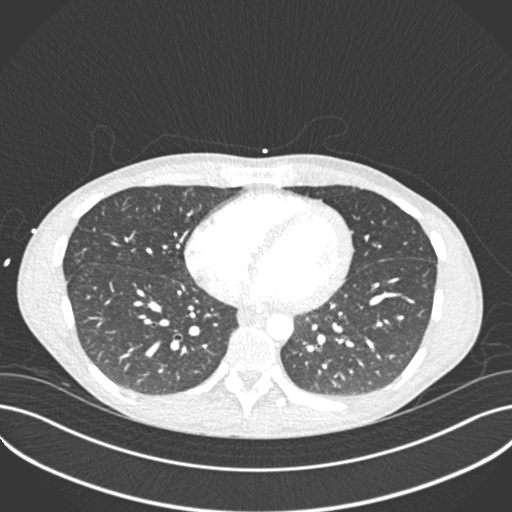
[im 127/301  mediastinal]
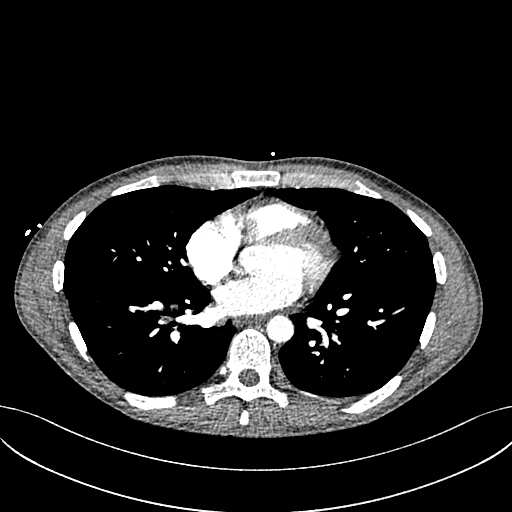
[im 143/301  lung]
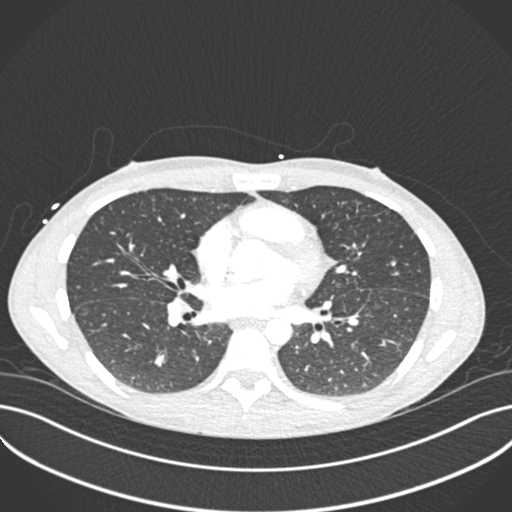
[im 158/301  mediastinal]
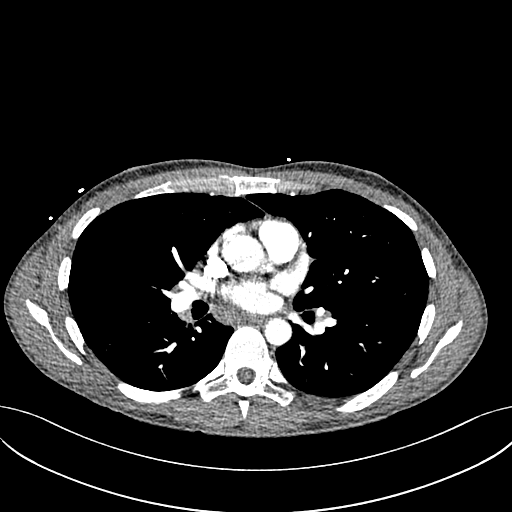
[im 174/301  lung]
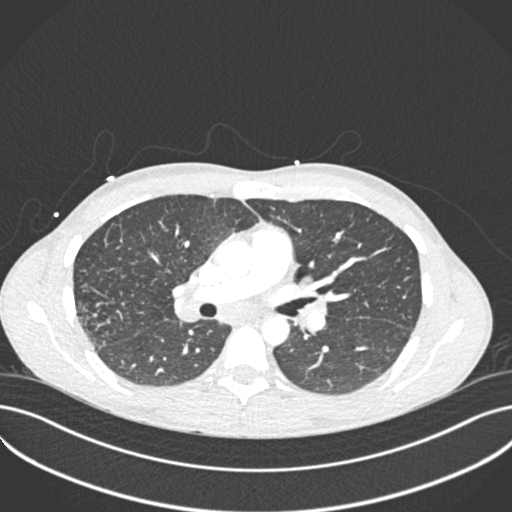
[im 190/301  mediastinal]
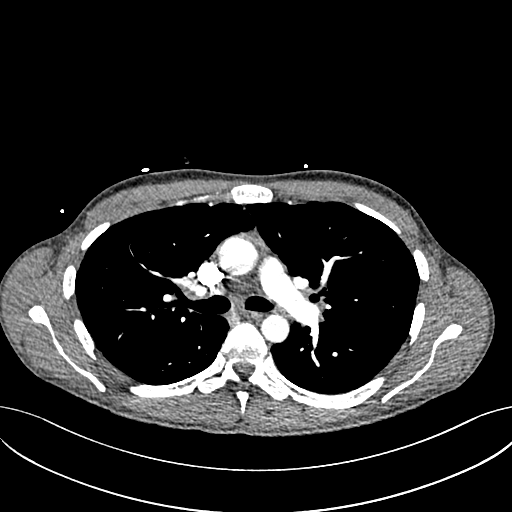
[im 206/301  lung]
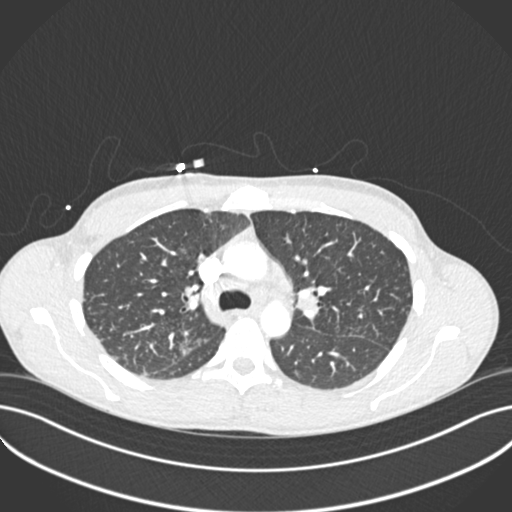
[im 222/301  mediastinal]
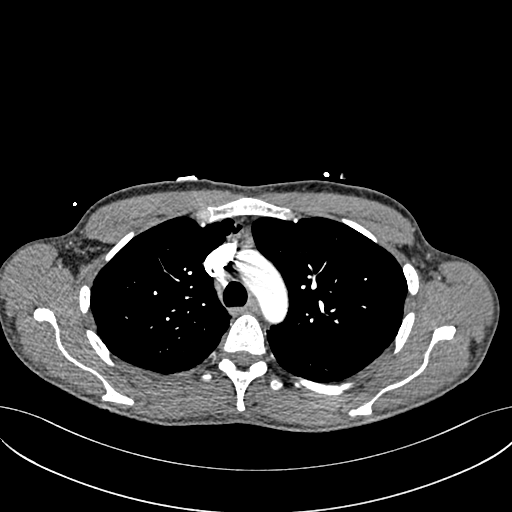
[im 237/301  lung]
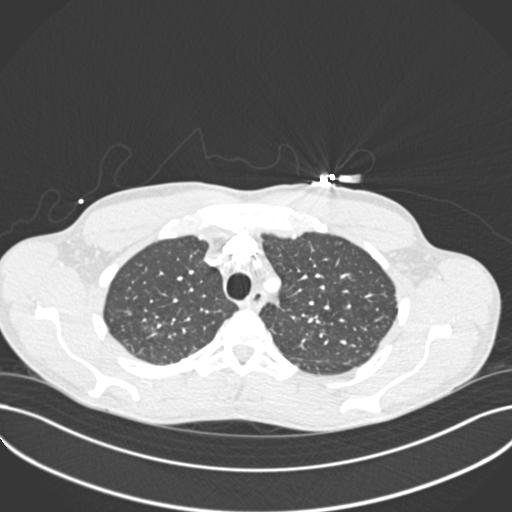
[im 253/301  mediastinal]
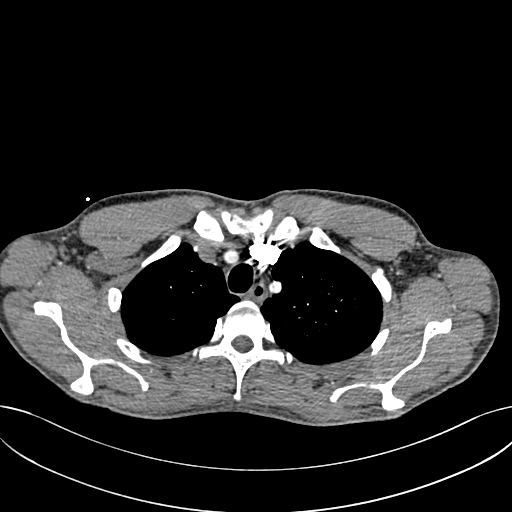
[im 269/301  lung]
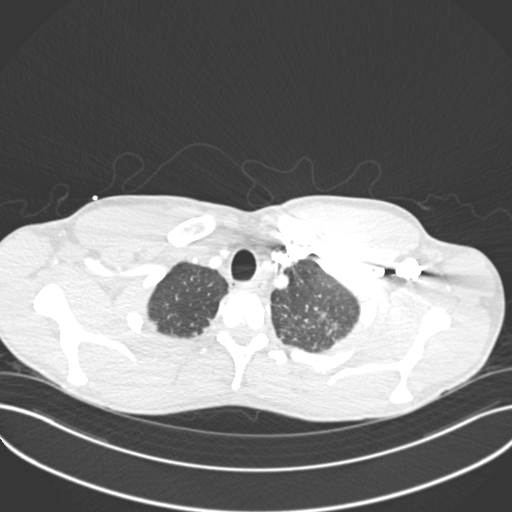
[im 285/301  mediastinal]
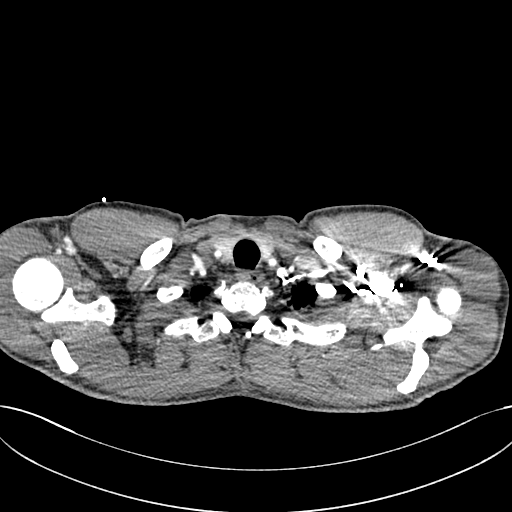

[Series 7: pe coronal mpr · coronal · 0.61mm/px · 1 of 112 slices shown]
[im 56/112  mediastinal]
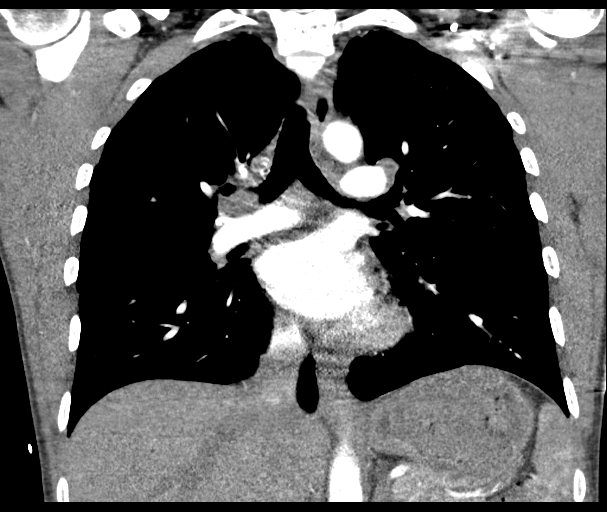

[19 of 36 positions shown; findings below may reference images not displayed]

FINDINGS: Cardiovascular: No filling defects in the pulmonary arteries to
suggest pulmonary emboli. Heart is normal size. Aorta is normal
caliber.

Mediastinum/Nodes: Mediastinal and bilateral hilar adenopathy. Index
subcarinal lymph node has a short axis diameter of 16 mm. Index
right hilar lymph node has a short axis diameter of 17 mm. Index
left hilar lymph node has a short axis diameter of 11 mm. No
axillary adenopathy.

Lungs/Pleura: Clustered tree-in-bud nodular densities noted in the
upper lobes bilaterally, right greater than left. No confluent
opacities otherwise. No effusions.

Upper Abdomen: Imaging into the upper abdomen demonstrates no acute
findings.

Musculoskeletal: Chest wall soft tissues are unremarkable. No acute
bony abnormality.

Review of the MIP images confirms the above findings.
IMPRESSION: No evidence of pulmonary embolus.

Mediastinal and bilateral hilar adenopathy. This is nonspecific and
could be reactive, related to inflammatory process such as
sarcoidosis, or lymphoma.

Tree-in-bud nodular densities in the upper lobes bilaterally, right
greater than left most compatible with small airways
disease/alveolitis. No confluent areas of consolidation.

## 2022-02-07 ENCOUNTER — Encounter (HOSPITAL_BASED_OUTPATIENT_CLINIC_OR_DEPARTMENT_OTHER): Payer: Self-pay | Admitting: Emergency Medicine

## 2022-02-07 ENCOUNTER — Emergency Department (HOSPITAL_BASED_OUTPATIENT_CLINIC_OR_DEPARTMENT_OTHER)
Admission: EM | Admit: 2022-02-07 | Discharge: 2022-02-07 | Disposition: A | Discharge status: Home / Self Care | Payer: Self-pay | Attending: Emergency Medicine | Admitting: Emergency Medicine

## 2022-02-07 ENCOUNTER — Emergency Department (HOSPITAL_BASED_OUTPATIENT_CLINIC_OR_DEPARTMENT_OTHER): Payer: Self-pay | Admitting: Radiology

## 2022-02-07 ENCOUNTER — Other Ambulatory Visit: Payer: Self-pay

## 2022-02-07 DIAGNOSIS — J181 Lobar pneumonia, unspecified organism: Secondary | ICD-10-CM | POA: Insufficient documentation

## 2022-02-07 DIAGNOSIS — F17210 Nicotine dependence, cigarettes, uncomplicated: Secondary | ICD-10-CM | POA: Insufficient documentation

## 2022-02-07 DIAGNOSIS — R000 Tachycardia, unspecified: Secondary | ICD-10-CM | POA: Insufficient documentation

## 2022-02-07 DIAGNOSIS — Z720 Tobacco use: Secondary | ICD-10-CM

## 2022-02-07 DIAGNOSIS — J189 Pneumonia, unspecified organism: Secondary | ICD-10-CM

## 2022-02-07 DIAGNOSIS — D72829 Elevated white blood cell count, unspecified: Secondary | ICD-10-CM | POA: Insufficient documentation

## 2022-02-07 LAB — BASIC METABOLIC PANEL
Anion gap: 16 — ABNORMAL HIGH (ref 5–15)
BUN: 19 mg/dL (ref 6–20)
CO2: 23 mmol/L (ref 22–32)
Calcium: 9.4 mg/dL (ref 8.9–10.3)
Chloride: 91 mmol/L — ABNORMAL LOW (ref 98–111)
Creatinine, Ser: 0.91 mg/dL (ref 0.61–1.24)
GFR, Estimated: >60 mL/min (ref >60)
Glucose, Bld: 117 mg/dL — ABNORMAL HIGH (ref 70–99)
Potassium: 3.9 mmol/L (ref 3.5–5.1)
Sodium: 130 mmol/L — ABNORMAL LOW (ref 135–145)

## 2022-02-07 LAB — CBC WITH DIFFERENTIAL/PLATELET
Abs Immature Granulocytes: 0.1 10*3/uL — ABNORMAL HIGH (ref 0.00–0.07)
Basophils Absolute: 0 10*3/uL (ref 0.0–0.1)
Basophils Relative: 0 %
Eosinophils Absolute: 0 10*3/uL (ref 0.0–0.5)
Eosinophils Relative: 0 %
HCT: 37.2 % — ABNORMAL LOW (ref 39.0–52.0)
Hemoglobin: 12.6 g/dL — ABNORMAL LOW (ref 13.0–17.0)
Immature Granulocytes: 1 %
Lymphocytes Relative: 5 %
Lymphs Abs: 0.5 10*3/uL — ABNORMAL LOW (ref 0.7–4.0)
MCH: 28.9 pg (ref 26.0–34.0)
MCHC: 33.9 g/dL (ref 30.0–36.0)
MCV: 85.3 fL (ref 80.0–100.0)
Monocytes Absolute: 1 10*3/uL (ref 0.1–1.0)
Monocytes Relative: 9 %
Neutro Abs: 9.2 10*3/uL — ABNORMAL HIGH (ref 1.7–7.7)
Neutrophils Relative %: 85 %
Platelets: 229 10*3/uL (ref 150–400)
RBC: 4.36 MIL/uL (ref 4.22–5.81)
RDW: 11.7 % (ref 11.5–15.5)
WBC: 10.8 10*3/uL — ABNORMAL HIGH (ref 4.0–10.5)
nRBC: 0 % (ref 0.0–0.2)

## 2022-02-07 LAB — TROPONIN I (HIGH SENSITIVITY): Troponin I (High Sensitivity): <2 ng/L (ref <18)

## 2022-02-07 MED ORDER — AMOXICILLIN 500 MG PO CAPS
1000.0000 mg | ORAL_CAPSULE | Freq: Once | ORAL | Status: AC
  Administered 2022-02-07: 1000 mg via ORAL
  Filled 2022-02-07: qty 2

## 2022-02-07 MED ORDER — IBUPROFEN 800 MG PO TABS
800.0000 mg | ORAL_TABLET | Freq: Once | ORAL | Status: AC
  Administered 2022-02-07: 800 mg via ORAL
  Filled 2022-02-07: qty 1

## 2022-02-07 MED ORDER — ACETAMINOPHEN 325 MG PO TABS
325.0000 mg | ORAL_TABLET | Freq: Once | ORAL | Status: AC
  Administered 2022-02-07: 325 mg via ORAL
  Filled 2022-02-07: qty 1

## 2022-02-07 MED ORDER — AMOXICILLIN 500 MG PO CAPS: 1000.0000 mg | ORAL_CAPSULE | Freq: Two times a day (BID) | ORAL | 0 refills | Status: AC

## 2022-02-07 MED ORDER — OXYCODONE HCL 5 MG PO TABS: 5.0000 mg | ORAL_TABLET | ORAL | 0 refills | Status: AC | PRN

## 2022-02-07 MED ORDER — OXYCODONE-ACETAMINOPHEN 5-325 MG PO TABS
1.0000 | ORAL_TABLET | Freq: Once | ORAL | Status: AC
  Administered 2022-02-07: 1 via ORAL
  Filled 2022-02-07: qty 1

## 2022-02-07 NOTE — ED Triage Notes (Signed)
Pt c/o shortness of breath with inhalation and expiration since this am, pt further endorses sharp pain with inhalation

## 2022-02-07 NOTE — ED Provider Notes (Signed)
[facility] EMERGENCY DEPT Provider Note   CSN: [id number] Arrival date & time: 02/07/22  0258     History  Chief Complaint  Patient presents with   Shortness of Breath    Ian Walker is a 36 y.o. male.  The history is provided by the patient.  Shortness of Breath He comes in because of pain in the right side of his chest which started yesterday.  Pain is worse with breathing and and with breathing out.  He states he is short of breath to the extent that he is afraid to take a breath because it hurts.  He had fever at home as high as 100.2 with associated chills and sweats.  He denies any cough.  He denies any sick contacts.  He is a cigarette smoker.   Home Medications Prior to Admission medications   Medication Sig Start Date End Date Taking? Authorizing Provider  amoxicillin (AMOXIL) 500 MG capsule Take 2 capsules (1,000 mg total) by mouth 2 (two) times daily. 02/07/22  Yes Dione Booze, MD  oxyCODONE (ROXICODONE) 5 MG immediate release tablet Take 1 tablet (5 mg total) by mouth every 4 (four) hours as needed for severe pain. 02/07/22  Yes Dione Booze, MD      Allergies    Patient has no known allergies.    Review of Systems   Review of Systems  Respiratory:  Positive for shortness of breath.   All other systems reviewed and are negative.   Physical Exam Updated Vital Signs BP (!) 119/91   Pulse (!) 122   Temp 98.9 F (37.2 C) (Oral)   Resp (!) 22   Ht 6' (1.829 m)   Wt 66.7 kg   SpO2 97%   BMI 19.94 kg/m  Physical Exam Vitals and nursing note reviewed.   36 year old male, resting comfortably and in no acute distress. Vital signs are significant for slightly elevated respiratory rate and elevated heart rate. Oxygen saturation is 97%, which is normal. Head is normocephalic and atraumatic. PERRLA, EOMI. Oropharynx is clear. Neck is nontender and supple without adenopathy or JVD. Back is nontender and there is no CVA tenderness. Lungs are  clear without rales, wheezes, or rhonchi. Chest is nontender. Heart has regular rate and rhythm without murmur. Abdomen is soft, flat, nontender. Extremities have no cyanosis or edema, full range of motion is present. Skin is warm and dry without rash. Neurologic: Mental status is normal, cranial nerves are intact, moves all extremities equally.  ED Results / Procedures / Treatments   Labs (all labs ordered are listed, but only abnormal results are displayed) Labs Reviewed  CBC WITH DIFFERENTIAL/PLATELET - Abnormal; Notable for the following components:      Result Value   WBC 10.8 (*)    Hemoglobin 12.6 (*)    HCT 37.2 (*)    Neutro Abs 9.2 (*)    Lymphs Abs 0.5 (*)    Abs Immature Granulocytes 0.10 (*)    All other components within normal limits  BASIC METABOLIC PANEL - Abnormal; Notable for the following components:   Sodium 130 (*)    Chloride 91 (*)    Glucose, Bld 117 (*)    Anion gap 16 (*)    All other components within normal limits  TROPONIN I (HIGH SENSITIVITY)  TROPONIN I (HIGH SENSITIVITY)    EKG EKG Interpretation  Date/Time:  Sunday February 07 2022 03:03:58 EST Ventricular Rate:  130 PR Interval:  112 QRS Duration: 86 QT Interval:  288 QTC Calculation: 423 R Axis:   79 Text Interpretation: Sinus tachycardia Left ventricular hypertrophy with repolarization abnormality ( Sokolow-Lyon , Romhilt-Estes ) Abnormal ECG When compared with ECG of 18-Sep-2020 09:13, HEART RATE has increased REPOLARIZATION ABNORMALITY is more pronounced Confirmed by Dione Booze ([id number]) on 02/07/2022 3:31:05 AM  Radiology DG Chest 2 View  Result Date: 02/07/2022 CLINICAL DATA:  Chest pain, shortness of breath. EXAM: CHEST - 2 VIEW COMPARISON:  10/17/2020. FINDINGS: The heart size and mediastinal contours are within normal limits. There is consolidation in the right middle lobe. The left lung is clear. A trace right pleural effusion is noted. A round density projects over the right  upper lobe measuring 2.1 cm. No acute osseous abnormality. IMPRESSION: 1. Right middle lobe consolidation, suspicious for pneumonia. Follow-up is recommended until resolution. 2. Trace right pleural effusion. 3. Round density projecting over the right upper lobe, which may be external to the patient. Clinical correlation is recommended. Electronically Signed   By: Thornell Sartorius M.D.   On: 02/07/2022 03:39    Procedures Procedures    Medications Ordered in ED Medications  ibuprofen (ADVIL) tablet 800 mg (has no administration in time range)  oxyCODONE-acetaminophen (PERCOCET/ROXICET) 5-325 MG per tablet 1 tablet (has no administration in time range)  acetaminophen (TYLENOL) tablet 325 mg (has no administration in time range)  amoxicillin (AMOXIL) capsule 1,000 mg (has no administration in time range)    ED Course/ Medical Decision Making/ A&P                           Medical Decision Making Amount and/or Complexity of Data Reviewed Labs: ordered. Radiology: ordered.  Risk OTC drugs. Prescription drug management.   Right-sided chest pain with reported fever and chills suggestive of respiratory tract infection.  Consider viral infection such as influenza, COVID-19, RSV.  Consider bacterial pneumonia.  No risk factors for pulmonary embolism.  Doubt ACS.  I have reviewed and interpreted his electrocardiogram, and my interpretation is sinus tachycardia, left ventricular hypertrophy with secondary repolarization changes.  Chest x-ray shows right middle lobe infiltrate.  I have independently viewed the images, and agree with radiologist interpretation.  I have reviewed and interpreted his laboratory tests, and my interpretation is mild hyponatremia which is not felt to be clinically significant, elevated random glucose which is probably worsened by stress but may indicate prediabetes, mild leukocytosis with left shift, mild anemia with hemoglobin not significantly changed from 09/18/2020.  Clinical  picture plus x-ray is strongly suggestive of right middle lobe pneumonia causing his symptoms.  Although he does have tachycardia, he is nontoxic in appearance and I feel he is safe for discharge.  I have ordered a dose of ibuprofen, amoxicillin, acetaminophen, oxycodone-acetaminophen.  I am discharging him with prescriptions for amoxicillin and a small number of oxycodone tablets.  Advised to use acetaminophen and/or ibuprofen as needed for pain control.  Return precautions discussed.  Also, I have counseled him on the need for smoking cessation.  Final Clinical Impression(s) / ED Diagnoses Final diagnoses:  Community acquired pneumonia of right middle lobe of lung  Tobacco abuse    Rx / DC Orders ED Discharge Orders          Ordered    amoxicillin (AMOXIL) 500 MG capsule  2 times daily        02/07/22 0601    oxyCODONE (ROXICODONE) 5 MG immediate release tablet  Every 4 hours PRN  02/07/22 0601              Dione Booze, MD 02/07/22 [phone]
